# Patient Record
Sex: Female | Born: 1970 | Race: Black or African American | Hispanic: No
Health system: Southern US, Community
[De-identification: ages and names within clinical notes are randomized; demographics above are authoritative.]

## PROBLEM LIST (undated history)

## (undated) DIAGNOSIS — G009 Bacterial meningitis, unspecified: Secondary | ICD-10-CM

## (undated) HISTORY — DX: Bacterial meningitis, unspecified: G00.9

---

## 2003-05-25 ENCOUNTER — Encounter: Payer: Self-pay | Admitting: Emergency Medicine

## 2003-05-25 ENCOUNTER — Emergency Department (HOSPITAL_COMMUNITY): Admission: EM | Admit: 2003-05-25 | Discharge: 2003-05-25 | Payer: Self-pay | Admitting: Emergency Medicine

## 2006-11-19 ENCOUNTER — Encounter: Admission: RE | Admit: 2006-11-19 | Discharge: 2006-11-19 | Payer: Self-pay | Admitting: Internal Medicine

## 2010-12-29 ENCOUNTER — Encounter: Payer: Self-pay | Admitting: Internal Medicine

## 2013-09-03 ENCOUNTER — Ambulatory Visit (INDEPENDENT_AMBULATORY_CARE_PROVIDER_SITE_OTHER): Payer: 59 | Admitting: Internal Medicine

## 2013-09-03 VITALS — BP 100/60 | HR 69 | Temp 98.8°F | Resp 16 | Ht 62.5 in | Wt 186.6 lb

## 2013-09-03 DIAGNOSIS — L02529 Furuncle unspecified hand: Secondary | ICD-10-CM

## 2013-09-03 DIAGNOSIS — L02522 Furuncle left hand: Secondary | ICD-10-CM

## 2013-09-03 MED ORDER — DOXYCYCLINE HYCLATE 100 MG PO CAPS: 100.0000 mg | ORAL_CAPSULE | Freq: Two times a day (BID) | ORAL | Status: DC

## 2013-09-03 NOTE — Progress Notes (Signed)
  Subjective:    Patient ID: Paula Hoffman, female    DOB: May 12, 1971, 42 y.o.   MRN: [id number]  HPI Patient has had 1 week history of swelling of left index finger. Was gradual in onset, worse in last 24 hours with small amount yellow to clear drainage. Bumped it on the steering wheel this morning which caused considerable pain.  Has been taking ibuprofen 400 mg several times a day for pain, this has given her some relief, but not complete.  No fever. No injury, no exposure to roses/plants.  Review of Systems  Constitutional: Negative for fever, chills and diaphoresis.  Allergic/Immunologic: Positive for food allergies.  patient allergic to strawberries which cause severe nausea and headache.  Has not had regular care for 1 1/2 years. Denies chronic illness, regular medication use or hospitalization.  Does not use tobacco, alcohol,drugs    Objective:   Physical Exam Very pleasant female in NAD.  Left finger with swelling and redness 1cm dorsum index prox phalanx and small amount cream colored drainage. Full ROM, slightly warm.     Assessment & Plan:  Boil, finger, left Culture obtained Doxycycline 100 mg po BID x 10 day Keep clean with soap and warm water, warm compresses several times a day. Return if no improvement or if worsening symptoms, fever/chills, increased swelling. Patient will make appointment for CPE.   I fully participated in this exam and agree with documentation RPDoolittleMD

## 2013-09-03 NOTE — Patient Instructions (Signed)
Abscess An abscess is an infected area that contains a collection of pus and debris.It can occur in almost any part of the body. An abscess is also known as a furuncle or boil. CAUSES  An abscess occurs when tissue gets infected. This can occur from blockage of oil or sweat glands, infection of hair follicles, or a minor injury to the skin. As the body tries to fight the infection, pus collects in the area and creates pressure under the skin. This pressure causes pain. People with weakened immune systems have difficulty fighting infections and get certain abscesses more often.  SYMPTOMS Usually an abscess develops on the skin and becomes a painful mass that is red, warm, and tender. If the abscess forms under the skin, you may feel a moveable soft area under the skin. Some abscesses break open (rupture) on their own, but most will continue to get worse without care. The infection can spread deeper into the body and eventually into the bloodstream, causing you to feel ill.  DIAGNOSIS  Your caregiver will take your medical history and perform a physical exam. A sample of fluid may also be taken from the abscess to determine what is causing your infection. TREATMENT  Your caregiver may prescribe antibiotic medicines to fight the infection. However, taking antibiotics alone usually does not cure an abscess. Your caregiver may need to make a small cut (incision) in the abscess to drain the pus. In some cases, gauze is packed into the abscess to reduce pain and to continue draining the area. HOME CARE INSTRUCTIONS   Only take over-the-counter or prescription medicines for pain, discomfort, or fever as directed by your caregiver.  If you were prescribed antibiotics, take them as directed. Finish them even if you start to feel better.  If gauze is used, follow your caregiver's directions for changing the gauze.  To avoid spreading the infection:  Keep your draining abscess covered with a  bandage.  Wash your hands well.  Do not share personal care items, towels, or whirlpools with others.  Avoid skin contact with others.  Keep your skin and clothes clean around the abscess.  Keep all follow-up appointments as directed by your caregiver. SEEK MEDICAL CARE IF:   You have increased pain, swelling, redness, fluid drainage, or bleeding.  You have muscle aches, chills, or a general ill feeling.  You have a fever. MAKE SURE YOU:   Understand these instructions.  Will watch your condition.  Will get help right away if you are not doing well or get worse. Document Released: 09/03/2005 Document Revised: 05/25/2012 Document Reviewed: 02/06/2012 [employer] Patient Information 2014 [employer].  

## 2013-09-06 ENCOUNTER — Telehealth: Payer: Self-pay

## 2013-09-06 LAB — WOUND CULTURE: Gram Stain: NONE SEEN

## 2013-09-06 NOTE — Telephone Encounter (Signed)
Patient notified culture positive for MRSA and take all doxy. She voiced understanding.

## 2013-09-06 NOTE — Telephone Encounter (Signed)
Patient calling about lab results. Cb# [phone]

## 2013-11-23 ENCOUNTER — Other Ambulatory Visit: Payer: Self-pay

## 2013-12-31 ENCOUNTER — Ambulatory Visit (INDEPENDENT_AMBULATORY_CARE_PROVIDER_SITE_OTHER): Payer: 59 | Admitting: Internal Medicine

## 2013-12-31 VITALS — BP 128/80 | HR 83 | Temp 98.8°F | Resp 16 | Ht 62.0 in | Wt 190.0 lb

## 2013-12-31 DIAGNOSIS — R059 Cough, unspecified: Secondary | ICD-10-CM

## 2013-12-31 DIAGNOSIS — J019 Acute sinusitis, unspecified: Secondary | ICD-10-CM

## 2013-12-31 DIAGNOSIS — R509 Fever, unspecified: Secondary | ICD-10-CM

## 2013-12-31 DIAGNOSIS — R05 Cough: Secondary | ICD-10-CM

## 2013-12-31 LAB — POCT CBC
GRANULOCYTE PERCENT: 80 % (ref 37–80)
HEMATOCRIT: 32.5 % — AB (ref 37.7–47.9)
Hemoglobin: 9.8 g/dL — AB (ref 12.2–16.2)
Lymph, poc: 2.1 (ref 0.6–3.4)
MCH, POC: 25.7 pg — AB (ref 27–31.2)
MCHC: 30.2 g/dL — AB (ref 31.8–35.4)
MCV: 85 fL (ref 80–97)
MID (cbc): 0.4 (ref 0–0.9)
MPV: 6.9 fL (ref 0–99.8)
POC GRANULOCYTE: 10.1 — AB (ref 2–6.9)
POC LYMPH %: 16.5 % (ref 10–50)
POC MID %: 3.5 % (ref 0–12)
Platelet Count, POC: 468 10*3/uL — AB (ref 142–424)
RBC: 3.82 M/uL — AB (ref 4.04–5.48)
RDW, POC: 15.3 %
WBC: 12.6 10*3/uL — AB (ref 4.6–10.2)

## 2013-12-31 MED ORDER — AMOXICILLIN 875 MG PO TABS: 875.0000 mg | ORAL_TABLET | Freq: Two times a day (BID) | ORAL | Status: DC

## 2013-12-31 NOTE — Progress Notes (Signed)
Subjective:    Patient ID: Paula Hoffman, female    DOB: 10-30-1971, 43 y.o.   MRN: [id number] This chart was scribed for Ellamae Sia, MD by Nicholos Johns, Medical Scribe. This patient's care was started at 10:30 AM.  HPI HPI Comments: Paula Hoffman is a 43 y.o. female who presents to the [facility] for several complaints. Pt reports clear rhinorrhea, onset 1 week ago. States nose is "running like a faucet mainly from right nostril." She also reports HA, onset 4 days ago, throbbing at the base of neck radiating to lower back. States neck is mildy stiff. Reports some mild intermittent SOB that also began at that time. Reports left ear pain and pressure began, onset 1 day ago. Dry cough. Spells of hot and cold, and chills. Taking advil, dayquil, and nyquil with some relief. Pt is anemic and in the past has had slow release iron treatments; is currently not doing the treatments anymore. Pt has a regular PCP. Denies sore throat.   Review of Systems    Objective:  Physical Exam  Nursing note and vitals reviewed. Constitutional: She is oriented to person, place, and time. She appears well-developed and well-nourished. No distress.  HENT:  Head: Normocephalic and atraumatic.  Right Ear: External ear normal.  Left Ear: External ear normal.  Copious rhinorrhea. Purulent. Tender maxillary areas  Eyes: EOM are normal.  Neck: Neck supple. No tracheal deviation present.  Cardiovascular: Normal rate, regular rhythm and normal heart sounds.  Exam reveals no gallop and no friction rub.   No murmur heard. Pulmonary/Chest: Effort normal and breath sounds normal. No respiratory distress. She has no wheezes. She has no rales.  Musculoskeletal: Normal range of motion.  Neurological: She is alert and oriented to person, place, and time.  Skin: Skin is warm and dry.  Psychiatric: She has a normal mood and affect. Her behavior is normal.   Results for  orders placed in visit on 12/31/13  POCT CBC      Result Value Range   WBC 12.6 (*) 4.6 - 10.2 K/uL   Lymph, poc 2.1  0.6 - 3.4   POC LYMPH PERCENT 16.5  10 - 50 %L   MID (cbc) 0.4  0 - 0.9   POC MID % 3.5  0 - 12 %M   POC Granulocyte 10.1 (*) 2 - 6.9   Granulocyte percent 80.0  37 - 80 %G   RBC 3.82 (*) 4.04 - 5.48 M/uL   Hemoglobin 9.8 (*) 12.2 - 16.2 g/dL   HCT, POC 82.9 (*) 56.2 - 47.9 %   MCV 85.0  80 - 97 fL   MCH, POC 25.7 (*) 27 - 31.2 pg   MCHC 30.2 (*) 31.8 - 35.4 g/dL   RDW, POC 13.0     Platelet Count, POC 468 (*) 142 - 424 K/uL   MPV 6.9  0 - 99.8 fL    Assessment & Plan:  Pt is advised she will be treated for a bacterial infection with amoxicillin. Pt is also advised she can use Sudafed to treat the rhinorrhea. Pt is encouraged to start back slow release iron treatments.   I have completed the patient encounter in its entirety as documented by the scribe, with editing by me where necessary. Harley Mccartney P. Merla Riches, M.D. Fever, unspecified - Plan: POCT CBC  Acute sinusitis, unspecified - Plan: amoxicillin (AMOXIL) 875 MG tablet  Meds ordered this encounter  Medications  . amoxicillin (AMOXIL) 875 MG  tablet    Sig: Take 1 tablet (875 mg total) by mouth 2 (two) times daily.    Dispense:  20 tablet    Refill:  0

## 2014-11-13 ENCOUNTER — Emergency Department (HOSPITAL_COMMUNITY): Payer: 59

## 2014-11-13 ENCOUNTER — Encounter (HOSPITAL_COMMUNITY): Payer: Self-pay | Admitting: Family Medicine

## 2014-11-13 ENCOUNTER — Emergency Department (HOSPITAL_COMMUNITY)
Admission: EM | Admit: 2014-11-13 | Discharge: 2014-11-13 | Disposition: A | Discharge status: Home / Self Care | Payer: 59 | Attending: Emergency Medicine | Admitting: Emergency Medicine

## 2014-11-13 DIAGNOSIS — R519 Headache, unspecified: Secondary | ICD-10-CM

## 2014-11-13 DIAGNOSIS — M542 Cervicalgia: Secondary | ICD-10-CM | POA: Insufficient documentation

## 2014-11-13 DIAGNOSIS — Z792 Long term (current) use of antibiotics: Secondary | ICD-10-CM | POA: Diagnosis not present

## 2014-11-13 DIAGNOSIS — H53149 Visual discomfort, unspecified: Secondary | ICD-10-CM | POA: Insufficient documentation

## 2014-11-13 DIAGNOSIS — R51 Headache: Secondary | ICD-10-CM | POA: Diagnosis present

## 2014-11-13 DIAGNOSIS — R112 Nausea with vomiting, unspecified: Secondary | ICD-10-CM | POA: Insufficient documentation

## 2014-11-13 LAB — CBC WITH DIFFERENTIAL/PLATELET
BASOS ABS: 0 10*3/uL (ref 0.0–0.1)
BASOS PCT: 0 % (ref 0–1)
EOS ABS: 0 10*3/uL (ref 0.0–0.7)
Eosinophils Relative: 0 % (ref 0–5)
HCT: 33 % — ABNORMAL LOW (ref 36.0–46.0)
Hemoglobin: 11.1 g/dL — ABNORMAL LOW (ref 12.0–15.0)
Lymphocytes Relative: 11 % — ABNORMAL LOW (ref 12–46)
Lymphs Abs: 1.1 10*3/uL (ref 0.7–4.0)
MCH: 29.5 pg (ref 26.0–34.0)
MCHC: 33.6 g/dL (ref 30.0–36.0)
MCV: 87.8 fL (ref 78.0–100.0)
MONOS PCT: 6 % (ref 3–12)
Monocytes Absolute: 0.6 10*3/uL (ref 0.1–1.0)
NEUTROS ABS: 8.1 10*3/uL — AB (ref 1.7–7.7)
Neutrophils Relative %: 83 % — ABNORMAL HIGH (ref 43–77)
PLATELETS: 374 10*3/uL (ref 150–400)
RBC: 3.76 MIL/uL — ABNORMAL LOW (ref 3.87–5.11)
RDW: 13.3 % (ref 11.5–15.5)
WBC: 9.8 10*3/uL (ref 4.0–10.5)

## 2014-11-13 LAB — I-STAT CHEM 8, ED
BUN: 3 mg/dL — ABNORMAL LOW (ref 6–23)
Calcium, Ion: 1.14 mmol/L (ref 1.12–1.23)
Chloride: 100 mEq/L (ref 96–112)
Creatinine, Ser: 0.6 mg/dL (ref 0.50–1.10)
GLUCOSE: 118 mg/dL — AB (ref 70–99)
HCT: 35 % — ABNORMAL LOW (ref 36.0–46.0)
HEMOGLOBIN: 11.9 g/dL — AB (ref 12.0–15.0)
POTASSIUM: 3.3 meq/L — AB (ref 3.7–5.3)
Sodium: 137 mEq/L (ref 137–147)
TCO2: 24 mmol/L (ref 0–100)

## 2014-11-13 MED ORDER — TRAMADOL HCL 50 MG PO TABS: 50.0000 mg | ORAL_TABLET | Freq: Four times a day (QID) | ORAL | Status: DC | PRN

## 2014-11-13 MED ORDER — PROMETHAZINE HCL 25 MG PO TABS: 25.0000 mg | ORAL_TABLET | Freq: Four times a day (QID) | ORAL | Status: DC | PRN

## 2014-11-13 MED ORDER — SODIUM CHLORIDE 0.9 % IV BOLUS (SEPSIS)
500.0000 mL | Freq: Once | INTRAVENOUS | Status: AC
  Administered 2014-11-13: 500 mL via INTRAVENOUS

## 2014-11-13 MED ORDER — VALPROATE SODIUM 500 MG/5ML IV SOLN
500.0000 mg | Freq: Once | INTRAVENOUS | Status: AC
  Administered 2014-11-13: 500 mg via INTRAVENOUS
  Filled 2014-11-13: qty 5

## 2014-11-13 MED ORDER — PROMETHAZINE HCL 25 MG/ML IJ SOLN
25.0000 mg | Freq: Once | INTRAMUSCULAR | Status: AC
  Administered 2014-11-13: 25 mg via INTRAVENOUS
  Filled 2014-11-13 (×2): qty 1

## 2014-11-13 MED ORDER — FENTANYL CITRATE 0.05 MG/ML IJ SOLN
50.0000 ug | Freq: Once | INTRAMUSCULAR | Status: AC
  Administered 2014-11-13: 50 ug via INTRAVENOUS
  Filled 2014-11-13: qty 2

## 2014-11-13 MED ORDER — KETOROLAC TROMETHAMINE 30 MG/ML IJ SOLN
30.0000 mg | Freq: Once | INTRAMUSCULAR | Status: AC
  Administered 2014-11-13: 30 mg via INTRAVENOUS
  Filled 2014-11-13: qty 1
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
## 2014-11-13 NOTE — Discharge Instructions (Signed)

## 2014-11-13 NOTE — ED Notes (Signed)
Patient transported to CT 

## 2014-11-13 NOTE — ED Notes (Signed)
PT presents from home via POV with c/o headache that awoke pt from sleep this morning at 0345. PT reports pressure to anterior head that is worse with movement. Pt also reports emesis around 0730 and patient was actively vomiting upon arrival to room. PT is A&Ox4 and denies room-spinning dizziness or any focal neuro deficits.

## 2014-11-13 NOTE — ED Notes (Signed)
Pt remains monitored by blood pressure, pulse ox, and 5 lead.  

## 2014-11-13 NOTE — ED Notes (Signed)
Pt placed on monitor upon return to room. Pt was able to ambulate to restroom using stand by assistance. Pt remains monitored by blood pressure, pulse ox, and 5 lead.

## 2014-11-15 ENCOUNTER — Emergency Department (HOSPITAL_COMMUNITY)
Admission: EM | Admit: 2014-11-15 | Discharge: 2014-11-15 | Disposition: A | Discharge status: Home / Self Care | Payer: 59 | Attending: Emergency Medicine | Admitting: Emergency Medicine

## 2014-11-15 ENCOUNTER — Encounter (HOSPITAL_COMMUNITY): Payer: Self-pay | Admitting: *Deleted

## 2014-11-15 DIAGNOSIS — Z79899 Other long term (current) drug therapy: Secondary | ICD-10-CM | POA: Diagnosis not present

## 2014-11-15 DIAGNOSIS — Z7982 Long term (current) use of aspirin: Secondary | ICD-10-CM | POA: Diagnosis not present

## 2014-11-15 DIAGNOSIS — R519 Headache, unspecified: Secondary | ICD-10-CM

## 2014-11-15 DIAGNOSIS — Z720 Tobacco use: Secondary | ICD-10-CM | POA: Diagnosis not present

## 2014-11-15 DIAGNOSIS — R0981 Nasal congestion: Secondary | ICD-10-CM | POA: Insufficient documentation

## 2014-11-15 DIAGNOSIS — R51 Headache: Secondary | ICD-10-CM | POA: Insufficient documentation

## 2014-11-15 MED ORDER — METOCLOPRAMIDE HCL 5 MG/ML IJ SOLN
10.0000 mg | Freq: Once | INTRAMUSCULAR | Status: AC
  Administered 2014-11-15: 10 mg via INTRAMUSCULAR
  Filled 2014-11-15: qty 2

## 2014-11-15 MED ORDER — METOCLOPRAMIDE HCL 5 MG/ML IJ SOLN: 10.0000 mg | Freq: Once | INTRAMUSCULAR | Status: DC

## 2014-11-15 MED ORDER — KETOROLAC TROMETHAMINE 60 MG/2ML IM SOLN
60.0000 mg | Freq: Once | INTRAMUSCULAR | Status: AC
  Administered 2014-11-15: 60 mg via INTRAMUSCULAR
  Filled 2014-11-15: qty 2

## 2014-11-15 NOTE — ED Notes (Signed)
Pt was seen here on 12/7 for headache, prescribed tramdol and phenergan, reports no relief of headache, nausea and sensitivity to light.

## 2014-11-15 NOTE — ED Provider Notes (Signed)
CSN: [id number]     Arrival date & time 11/15/14  1026 History   First MD Initiated Contact with Patient 11/15/14 1317     Chief Complaint  Patient presents with  . Headache     (Consider location/radiation/quality/duration/timing/severity/associated sxs/prior Treatment) Patient is a 43 y.o. female presenting with headaches. The history is provided by the patient. No language interpreter was used.  Headache Pain location:  Generalized Quality:  Sharp Radiates to:  Does not radiate Severity currently:  8/10 Severity at highest:  8/10 Onset quality:  Gradual Duration:  3 days Timing:  Constant Progression:  Worsening Chronicity:  New Similar to prior headaches: no   Context: not activity   Relieved by:  Nothing Worsened by:  Nothing tried Associated symptoms: congestion and URI   Associated symptoms: no cough   Risk factors: no family hx of SAH     History reviewed. No pertinent past medical history. History reviewed. No pertinent past surgical history. Family History  Problem Relation Age of Onset  . Hypertension Mother   . Cancer Mother   . Prostate cancer Father    History  Substance Use Topics  . Smoking status: Never Smoker   . Smokeless tobacco: Not on file  . Alcohol Use: No   OB History    No data available     Review of Systems  HENT: Positive for congestion.   Respiratory: Negative for cough.   Neurological: Positive for headaches.  All other systems reviewed and are negative.     Allergies  Percocet and Strawberry  Home Medications   Prior to Admission medications   Medication Sig Start Date End Date Taking? Authorizing Provider  amoxicillin (AMOXIL) 875 MG tablet Take 1 tablet (875 mg total) by mouth 2 (two) times daily. Patient not taking: Reported on 11/13/2014 12/31/13   Tonye Pearsonobert P Doolittle, MD  aspirin-acetaminophen-caffeine [facility](EXCEDRIN MIGRAINE) [phone] [id number] MG per tablet Take 1 tablet by mouth every 6 (six) hours as needed for headache.     Historical Provider, MD  doxycycline (VIBRAMYCIN) 100 MG capsule Take 1 capsule (100 mg total) by mouth 2 (two) times daily. Patient not taking: Reported on 11/13/2014 09/03/13   Tonye Pearsonobert P Doolittle, MD  Doxylamine-Phenylephrine-APAP (VICKS NYQUIL SINUS PO) Take by mouth.    Historical Provider, MD  Phenylephrine-Acetaminophen (VICKS DAYQUIL SINUS PO) Take by mouth.    Historical Provider, MD  promethazine (PHENERGAN) 25 MG tablet Take 1 tablet (25 mg total) by mouth every 6 (six) hours as needed for nausea. 11/13/14   Juliet RudeNathan R. Pickering, MD  traMADol (ULTRAM) 50 MG tablet Take 1 tablet (50 mg total) by mouth every 6 (six) hours as needed. 11/13/14   Juliet RudeNathan R. Pickering, MD   BP 132/67 mmHg  Pulse 79  Temp(Src) 99.9 F (37.7 C) (Oral)  Resp 17  SpO2 100%  LMP 10/27/2014 (Approximate) Physical Exam  Constitutional: She is oriented to person, place, and time. She appears well-developed and well-nourished.  HENT:  Head: Normocephalic and atraumatic.  Eyes: Conjunctivae and EOM are normal. Pupils are equal, round, and reactive to light.  Neck: Normal range of motion.  Cardiovascular: Normal rate, regular rhythm and normal heart sounds.   Pulmonary/Chest: Effort normal and breath sounds normal.  Abdominal: Soft. She exhibits no distension.  Musculoskeletal: Normal range of motion.  Neurological: She is alert and oriented to person, place, and time.  Skin: Skin is warm.  Psychiatric: She has a normal mood and affect.  Nursing note and vitals reviewed.   ED  Course  Procedures (including critical care time) Labs Review Labs Reviewed - No data to display  Imaging Review No results found.   EKG Interpretation None      MDM  Pt had normal labs 2 days ago.  Normal ct of head.   Pt reports she feels only slightly better.     Final diagnoses:  Nonintractable headache, unspecified chronicity pattern, unspecified headache type    Pt offered migraine cocktail IV.  Pt declined Iv  medications.  Pt will accept injection.  Pt is improving.    Lonia SkinnerLeslie K AyrshireSofia, PA-C 11/15/14 1529  Vanetta MuldersScott Zackowski, MD 11/16/14 [phone]

## 2014-11-15 NOTE — Discharge Instructions (Signed)

## 2014-11-18 ENCOUNTER — Encounter (HOSPITAL_COMMUNITY): Payer: Self-pay | Admitting: Emergency Medicine

## 2014-11-18 ENCOUNTER — Inpatient Hospital Stay (HOSPITAL_COMMUNITY)
Admission: EM | Admit: 2014-11-18 | Discharge: 2014-11-23 | DRG: 025 | Disposition: A | Discharge status: Home Health | Payer: 59 | Attending: Family Medicine | Admitting: Family Medicine

## 2014-11-18 ENCOUNTER — Emergency Department (HOSPITAL_COMMUNITY): Payer: 59

## 2014-11-18 DIAGNOSIS — E162 Hypoglycemia, unspecified: Secondary | ICD-10-CM | POA: Diagnosis not present

## 2014-11-18 DIAGNOSIS — D509 Iron deficiency anemia, unspecified: Secondary | ICD-10-CM | POA: Diagnosis present

## 2014-11-18 DIAGNOSIS — G960 Cerebrospinal fluid leak: Principal | ICD-10-CM | POA: Diagnosis present

## 2014-11-18 DIAGNOSIS — D649 Anemia, unspecified: Secondary | ICD-10-CM

## 2014-11-18 DIAGNOSIS — Q018 Encephalocele of other sites: Secondary | ICD-10-CM

## 2014-11-18 DIAGNOSIS — G009 Bacterial meningitis, unspecified: Secondary | ICD-10-CM | POA: Diagnosis present

## 2014-11-18 DIAGNOSIS — G039 Meningitis, unspecified: Secondary | ICD-10-CM | POA: Diagnosis present

## 2014-11-18 DIAGNOSIS — G9601 Cranial cerebrospinal fluid leak, spontaneous: Secondary | ICD-10-CM | POA: Diagnosis present

## 2014-11-18 DIAGNOSIS — R519 Headache, unspecified: Secondary | ICD-10-CM

## 2014-11-18 DIAGNOSIS — J323 Chronic sphenoidal sinusitis: Secondary | ICD-10-CM | POA: Diagnosis present

## 2014-11-18 DIAGNOSIS — R51 Headache: Secondary | ICD-10-CM

## 2014-11-18 LAB — BASIC METABOLIC PANEL WITH GFR
Anion gap: 13 (ref 5–15)
BUN: 6 mg/dL (ref 6–23)
CO2: 24 meq/L (ref 19–32)
Calcium: 8.9 mg/dL (ref 8.4–10.5)
Chloride: 97 meq/L (ref 96–112)
Creatinine, Ser: 0.72 mg/dL (ref 0.50–1.10)
GFR calc Af Amer: >90 mL/min
GFR calc non Af Amer: >90 mL/min
Glucose, Bld: 99 mg/dL (ref 70–99)
Potassium: 3.4 meq/L — ABNORMAL LOW (ref 3.7–5.3)
Sodium: 134 meq/L — ABNORMAL LOW (ref 137–147)

## 2014-11-18 LAB — GRAM STAIN

## 2014-11-18 LAB — CBC WITH DIFFERENTIAL/PLATELET
Basophils Absolute: 0 10*3/uL (ref 0.0–0.1)
Basophils Relative: 0 % (ref 0–1)
Eosinophils Absolute: 0 10*3/uL (ref 0.0–0.7)
Eosinophils Relative: 0 % (ref 0–5)
HCT: 33.6 % — ABNORMAL LOW (ref 36.0–46.0)
Hemoglobin: 11.1 g/dL — ABNORMAL LOW (ref 12.0–15.0)
Lymphocytes Relative: 17 % (ref 12–46)
Lymphs Abs: 1.8 10*3/uL (ref 0.7–4.0)
MCH: 28.2 pg (ref 26.0–34.0)
MCHC: 33 g/dL (ref 30.0–36.0)
MCV: 85.5 fL (ref 78.0–100.0)
Monocytes Absolute: 0.9 10*3/uL (ref 0.1–1.0)
Monocytes Relative: 9 % (ref 3–12)
Neutro Abs: 7.8 10*3/uL — ABNORMAL HIGH (ref 1.7–7.7)
Neutrophils Relative %: 74 % (ref 43–77)
Platelets: 390 10*3/uL (ref 150–400)
RBC: 3.93 MIL/uL (ref 3.87–5.11)
RDW: 13 % (ref 11.5–15.5)
WBC: 10.5 10*3/uL (ref 4.0–10.5)

## 2014-11-18 LAB — GLUCOSE, CSF: Glucose, CSF: 22 mg/dL — CL (ref 43–76)

## 2014-11-18 LAB — CSF CELL COUNT WITH DIFFERENTIAL
Eosinophils, CSF: 1 % (ref 0–1)
Lymphs, CSF: 5 % — ABNORMAL LOW (ref 40–80)
Lymphs, CSF: 7 % — ABNORMAL LOW (ref 40–80)
Monocyte-Macrophage-Spinal Fluid: 2 % — ABNORMAL LOW (ref 15–45)
Monocyte-Macrophage-Spinal Fluid: 9 % — ABNORMAL LOW (ref 15–45)
RBC Count, CSF: 2 /mm3 — ABNORMAL HIGH
RBC Count, CSF: 7 /mm3 — ABNORMAL HIGH
Segmented Neutrophils-CSF: 86 % — ABNORMAL HIGH (ref 0–6)
Segmented Neutrophils-CSF: 90 % — ABNORMAL HIGH (ref 0–6)
Tube #: 1
Tube #: 4
WBC, CSF: 2000 /mm3 (ref 0–5)
WBC, CSF: 2900 /mm3 (ref 0–5)

## 2014-11-18 LAB — PROTEIN, CSF: Total  Protein, CSF: 126 mg/dL — ABNORMAL HIGH (ref 15–45)

## 2014-11-18 MED ORDER — PROCHLORPERAZINE EDISYLATE 5 MG/ML IJ SOLN
10.0000 mg | Freq: Four times a day (QID) | INTRAMUSCULAR | Status: DC | PRN
  Administered 2014-11-18: 10 mg via INTRAVENOUS
  Filled 2014-11-18 (×2): qty 2

## 2014-11-18 MED ORDER — VANCOMYCIN HCL IN DEXTROSE 1-5 GM/200ML-% IV SOLN
1000.0000 mg | Freq: Three times a day (TID) | INTRAVENOUS | Status: DC
  Administered 2014-11-19 – 2014-11-23 (×13): 1000 mg via INTRAVENOUS
  Filled 2014-11-18 (×17): qty 200

## 2014-11-18 MED ORDER — KETOROLAC TROMETHAMINE 15 MG/ML IJ SOLN
15.0000 mg | Freq: Once | INTRAMUSCULAR | Status: AC
  Administered 2014-11-18: 15 mg via INTRAVENOUS
  Filled 2014-11-18: qty 1

## 2014-11-18 MED ORDER — DEXTROSE 5 % IV SOLN: 2.0000 g | Freq: Two times a day (BID) | INTRAVENOUS | Status: DC

## 2014-11-18 MED ORDER — DEXTROSE 5 % IV SOLN
2.0000 g | Freq: Two times a day (BID) | INTRAVENOUS | Status: DC
  Administered 2014-11-19 – 2014-11-23 (×10): 2 g via INTRAVENOUS
  Filled 2014-11-18 (×11): qty 2

## 2014-11-18 MED ORDER — SODIUM CHLORIDE 0.9 % IV SOLN
1250.0000 mg | Freq: Once | INTRAVENOUS | Status: AC
  Administered 2014-11-18: 1250 mg via INTRAVENOUS
  Filled 2014-11-18: qty 1250

## 2014-11-18 MED ORDER — DIPHENHYDRAMINE HCL 50 MG/ML IJ SOLN
12.5000 mg | Freq: Once | INTRAMUSCULAR | Status: AC
  Administered 2014-11-18: 12.5 mg via INTRAVENOUS
  Filled 2014-11-18: qty 1

## 2014-11-18 MED ORDER — SODIUM CHLORIDE 0.9 % IV BOLUS (SEPSIS)
1000.0000 mL | Freq: Once | INTRAVENOUS | Status: AC
  Administered 2014-11-18: 1000 mL via INTRAVENOUS

## 2014-11-18 MED ORDER — LIDOCAINE HCL (PF) 1 % IJ SOLN
30.0000 mL | Freq: Once | INTRAMUSCULAR | Status: AC
  Administered 2014-11-18: 30 mL
  Filled 2014-11-18: qty 30

## 2014-11-18 MED ORDER — DEXTROSE 5 % IV SOLN
2.0000 g | Freq: Once | INTRAVENOUS | Status: AC
  Administered 2014-11-18: 2 g via INTRAVENOUS
  Filled 2014-11-18: qty 2

## 2014-11-18 MED ORDER — DEXAMETHASONE SODIUM PHOSPHATE 10 MG/ML IJ SOLN
10.0000 mg | Freq: Once | INTRAMUSCULAR | Status: AC
  Administered 2014-11-18: 10 mg via INTRAVENOUS
  Filled 2014-11-18: qty 1

## 2014-11-18 MED ORDER — IOHEXOL 350 MG/ML SOLN
50.0000 mL | Freq: Once | INTRAVENOUS | Status: AC | PRN
  Administered 2014-11-18: 50 mL via INTRAVENOUS

## 2014-11-18 MED ORDER — ENOXAPARIN SODIUM 40 MG/0.4ML ~~LOC~~ SOLN
40.0000 mg | SUBCUTANEOUS | Status: DC
  Administered 2014-11-18 – 2014-11-19 (×2): 40 mg via SUBCUTANEOUS
  Filled 2014-11-18 (×2): qty 0.4

## 2014-11-18 NOTE — Progress Notes (Signed)
Pt arrived on unit 2115 hrs, A&O, no C/O pain, temp 100.6, family medicine on call notified, pt oriented to room and equipment.

## 2014-11-18 NOTE — ED Notes (Signed)
Headache that started on Monday morning; seen on Monday, headache never went away, worse by Wednesday despite taking Tramadol and phenergan. On Wednesday, got IM medication and headache subsided enough to function through Friday. Yesterday it started to get worse again and by 0300 this morning pressure she cannot stand. Frontal headache and behind eyes. Sensitive to noise and light she states. States a pulsating pressure pain.

## 2014-11-18 NOTE — ED Notes (Signed)
CSF WBC 2900 and 2000; reported to Dr. Juleen ChinaKohut at [phone]

## 2014-11-18 NOTE — ED Notes (Signed)
Dr. Juleen ChinaKohut at bedside for LP

## 2014-11-18 NOTE — Progress Notes (Signed)
ANTIBIOTIC CONSULT NOTE - INITIAL  Pharmacy Consult:  Vancomycin Indication:  Meningitis  Allergies  Allergen Reactions  . Percocet [Oxycodone-Acetaminophen] Nausea Only and Other (See Comments)    Feels high  . Strawberry Nausea Only    Headache  . Tramadol Nausea Only    Patient Measurements: Height: 5\' 1"  (154.9 cm) Weight: 185 lb (83.915 kg) IBW/kg (Calculated) : 47.8  Vital Signs: Temp: 100.9 F (38.3 C) (12/12 1332) Temp Source: Oral (12/12 1332) BP: 136/80 mmHg (12/12 1530) Pulse Rate: 77 (12/12 1530)  Labs:  Recent Labs  11/18/14 1330  WBC 10.5  HGB 11.1*  PLT 390  CREATININE 0.72   Estimated Creatinine Clearance: 88.1 mL/min (by C-G formula based on Cr of 0.72). No results for input(s): VANCOTROUGH, VANCOPEAK, VANCORANDOM, GENTTROUGH, GENTPEAK, GENTRANDOM, TOBRATROUGH, TOBRAPEAK, TOBRARND, AMIKACINPEAK, AMIKACINTROU, AMIKACIN in the last 72 hours.   Microbiology: Recent Results (from the past 720 hour(s))  Gram stain - STAT with CSF culture     Status: None   Collection Time: 11/18/14  3:30 PM  Result Value Ref Range Status   Specimen Description CSF  Final   Special Requests NONE  Final   Gram Stain   Final    CYTOSPIN WBC PRESENT,BOTH PMN AND MONONUCLEAR NO ORGANISMS SEEN    Report Status 11/18/2014 FINAL  Final    Medical History: History reviewed. No pertinent past medical history.    Assessment: [age] YOF presented for the 3rd time this week with worsening headaches.  Pharmacy consulted to initiate vancomycin for rule out meningitis.  Noted Rocephin 2gm IV x 1 has been ordered.  Baseline labs reviewed.   Goal of Therapy:  Vancomycin trough level 15-20 mcg/ml   Plan:  - Vanc 1250mg  IV x 1 now, then 1gm IV Q8H - F/U with order to continue Rocephin - Monitor renal fxn, clinical progress, vanc trough at C[facility]    Aria Pickrell D. Laney Potashang, PharmD, BCPS Pager:  [phone] 11/18/2014, 4:28 PM

## 2014-11-18 NOTE — ED Provider Notes (Signed)
CSN: [id number]     Arrival date & time 11/18/14  1145 History   First MD Initiated Contact with Patient 11/18/14 1202     No chief complaint on file.    (Consider location/radiation/quality/duration/timing/severity/associated sxs/prior Treatment) HPI   43 year old female with headache. Onset approximately 5 days ago. Headache has been  persistent since. Pain is primarily in the frontal region "extending to the ear line. I feel like I have a migraine, the flu and sinusitis all at once."  Seen twice in ED for same. Previous w/u including CT head unremarkable. Subjective fever. Photo and phonophobia. No change in visual acuity. No blood thinners. No personal or family hx of cerebral aneurysm. Does have some neck pain in R lateral neck. Headache is worse when sitting up/leaning forward. No eye pain or drainage.    History reviewed. No pertinent past medical history. History reviewed. No pertinent past surgical history. Family History  Problem Relation Age of Onset  . Hypertension Mother   . Cancer Mother   . Prostate cancer Father    History  Substance Use Topics  . Smoking status: Never Smoker   . Smokeless tobacco: Not on file  . Alcohol Use: No   OB History    No data available     Review of Systems  All systems reviewed and negative, other than as noted in HPI.   Allergies  Percocet; Strawberry; and Tramadol  Home Medications   Prior to Admission medications   Medication Sig Start Date End Date Taking? Authorizing Provider  aspirin-sod bicarb-citric acid (ALKA-SELTZER) 325 MG TBEF tablet Take 325 mg by mouth every 6 (six) hours as needed.   Yes Historical Provider, MD  promethazine (PHENERGAN) 25 MG tablet Take 1 tablet (25 mg total) by mouth every 6 (six) hours as needed for nausea. 11/13/14  Yes Juliet RudeNathan R. Pickering, MD  amoxicillin (AMOXIL) 875 MG tablet Take 1 tablet (875 mg total) by mouth 2 (two) times daily. Patient not taking: Reported on 11/13/2014 12/31/13    Tonye Pearsonobert P Doolittle, MD  doxycycline (VIBRAMYCIN) 100 MG capsule Take 1 capsule (100 mg total) by mouth 2 (two) times daily. Patient not taking: Reported on 11/13/2014 09/03/13   Tonye Pearsonobert P Doolittle, MD  traMADol (ULTRAM) 50 MG tablet Take 1 tablet (50 mg total) by mouth every 6 (six) hours as needed. Patient not taking: Reported on 11/18/2014 11/13/14   Juliet RudeNathan R. Pickering, MD   BP 143/88 mmHg  Pulse 96  Temp(Src) 100.9 F (38.3 C) (Oral)  Resp 20  Ht 5\' 1"  (1.549 m)  Wt 185 lb (83.915 kg)  BMI 34.97 kg/m2  SpO2 100%  LMP 10/27/2014 (Approximate) Physical Exam  Constitutional: She is oriented to person, place, and time. She appears well-developed and well-nourished. No distress.  Laying in bed with eyes closed/lights out.   HENT:  Head: Normocephalic and atraumatic.  Eyes: Conjunctivae are normal. Pupils are equal, round, and reactive to light. Right eye exhibits no discharge. Left eye exhibits no discharge.  Neck: Normal range of motion. Neck supple.  No nuchal rigidty  Cardiovascular: Normal rate, regular rhythm and normal heart sounds.  Exam reveals no gallop and no friction rub.   No murmur heard. Pulmonary/Chest: Effort normal and breath sounds normal. No respiratory distress.  Abdominal: Soft. She exhibits no distension. There is no tenderness.  Musculoskeletal: She exhibits no edema or tenderness.  Neurological: She is alert and oriented to person, place, and time. No cranial nerve deficit. She exhibits normal muscle tone. Coordination  normal.  Good finger to nose b/l.   Skin: Skin is warm and dry. No rash noted.  Psychiatric: She has a normal mood and affect. Her behavior is normal. Thought content normal.  Nursing note and vitals reviewed.   ED Course  LUMBAR PUNCTURE Date/Time: 11/18/2014 3:20 PM Performed by: Raeford Razor Authorized by: Raeford Razor Consent: Verbal consent obtained. Risks and benefits: risks, benefits and alternatives were discussed Consent given  by: patient Required items: required blood products, implants, devices, and special equipment available Indications: evaluation for infection Anesthesia: local infiltration Local anesthetic: lidocaine 1% without epinephrine Anesthetic total: 5 ml Patient sedated: no Preparation: Patient was prepped and draped in the usual sterile fashion. Lumbar space: L3-L4 interspace Patient's position: left lateral decubitus Needle gauge: 20 Needle length: 3.5 in Number of attempts: 1 Fluid appearance: clear Tubes of fluid: 4 Total volume: 5 ml Post-procedure: adhesive bandage applied Patient tolerance: Patient tolerated the procedure well with no immediate complications    CRITICAL CARE Performed by: Raeford Razor   Total critical care time: 30 minutes  Critical care time was exclusive of separately billable procedures and treating other patients. Critical care was necessary to treat or prevent imminent or life-threatening deterioration. Critical care was time spent personally by me on the following activities: development of treatment plan with patient and/or surrogate as well as nursing, discussions with consultants, evaluation of patient's response to treatment, examination of patient, obtaining history from patient or surrogate, ordering and performing treatments and interventions, ordering and review of laboratory studies, ordering and review of radiographic studies, pulse oximetry and re-evaluation of patient's condition.   (including critical care time)  Labs Review Labs Reviewed  CBC WITH DIFFERENTIAL - Abnormal; Notable for the following:    Hemoglobin 11.1 (*)    HCT 33.6 (*)    Neutro Abs 7.8 (*)    All other components within normal limits  BASIC METABOLIC PANEL - Abnormal; Notable for the following:    Sodium 134 (*)    Potassium 3.4 (*)    All other components within normal limits  CSF CELL COUNT WITH DIFFERENTIAL - Abnormal; Notable for the following:    Appearance, CSF  CLOUDY (*)    RBC Count, CSF 7 (*)    WBC, CSF 2900 (*)    Segmented Neutrophils-CSF 86 (*)    Lymphs, CSF 5 (*)    Monocyte-Macrophage-Spinal Fluid 9 (*)    All other components within normal limits  CSF CELL COUNT WITH DIFFERENTIAL - Abnormal; Notable for the following:    Appearance, CSF CLOUDY (*)    RBC Count, CSF 2 (*)    WBC, CSF 2000 (*)    Segmented Neutrophils-CSF 90 (*)    Lymphs, CSF 7 (*)    Monocyte-Macrophage-Spinal Fluid 2 (*)    All other components within normal limits  GLUCOSE, CSF - Abnormal; Notable for the following:    Glucose, CSF 22 (*)    All other components within normal limits  PROTEIN, CSF - Abnormal; Notable for the following:    Total  Protein, CSF 126 (*)    All other components within normal limits  GRAM STAIN  CSF CULTURE  CULTURE, BLOOD (ROUTINE X 2)  CULTURE, BLOOD (ROUTINE X 2)    Imaging Review Ct Angio Head W/cm &/or Wo Cm  11/18/2014   CLINICAL DATA:  Headache for 5 days, worse yesterday. Pain located in the frontal area and behind the eyes. Light sensitivity.  EXAM: CT ANGIOGRAPHY HEAD  TECHNIQUE: Multidetector CT  imaging of the head was performed using the standard protocol during bolus administration of intravenous contrast. Multiplanar CT image reconstructions and MIPs were obtained to evaluate the vascular anatomy.  CONTRAST:  50mL OMNIPAQUE IOHEXOL 350 MG/ML SOLN  COMPARISON:  11/13/2014  FINDINGS: Noncontrast head CT is without evidence of acute cortical infarct, intracranial hemorrhage, mass, midline shift, or extra-axial fluid collection. Dilated perivascular space at the inferior right basal ganglia is unchanged. Ventricles and sulci are normal. There is no abnormal enhancement. Visualized orbits are unremarkable. Mastoid air cells are clear. There is subtotal right sphenoid sinus opacification, slightly improved from prior.  Visualized distal vertebral arteries are patent with the left being slightly dominant. PICA and SCA origins are  patent. Basilar artery is patent without stenosis. PCAs are unremarkable. Posterior communicating arteries are not clearly identified.  Internal carotid arteries are patent from skullbase to carotid termini without stenosis. There is a 1.5 cm outpouching from the inferior surface of the distal right supraclinoid ICA. This is somewhat funnel-shaped, however a vessel is not clearly seen arising from the tip of this outpouching. ACAs and MCAs are unremarkable.  Review of the MIP images confirms the above findings.  IMPRESSION: 1. No evidence of acute intracranial abnormality. 2. No major intracranial arterial occlusion or significant stenosis. 3. 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   Electronically Signed   By: Sebastian AcheAllen  Grady   On: 11/18/2014 15:05     EKG Interpretation None      MDM   Final diagnoses:  Headache  Meningitis     43yF with HA. Third ED visit within past week for same and no significant prior headache history. Febrile today. Does have neck pain but says more lateral neck and no meningeal signs on my exam. Prior imaging negative. Doubt meningitis, but cannot exclude. Will LP. Previous CT w/o acute finding. Done just around 6 hours after initial symptom onset. [facility] window is very good. Does not exclude aneurysm though.    CSF results are consistent with meningitis and actually more typical of bacterial versus viral meningitis.  Look well if indeed bacterial meningitis particularly with duration of symptoms. Denies recent antiobiotic use.  No organisms noted on gram stain. Rocephin/Vancomycin. Nonfocal neuro exam, no seizure, etc. Antivirals deferred.   Raeford RazorStephen Garyn Arlotta, MD 11/18/14 [id number]

## 2014-11-18 NOTE — ED Notes (Signed)
Patient transported to CT 

## 2014-11-18 NOTE — ED Notes (Signed)
Called phlebotomy to draw blood cultures. 

## 2014-11-18 NOTE — ED Notes (Signed)
Patient returned from CT

## 2014-11-18 NOTE — H&P (Signed)
[facility] Medicine [facility] Ad[facility] History and Physical Service Pager: [phone]  Patient name: Paula Hoffman Medical record number: [id number] Date of birth: [birth year] Age: 43 y.o. Gender: female  Primary Care Provider: No PCP Per Patient Consultants: none Code Status: full  Chief Complaint: headache  Assessment and Plan: Paula Hoffman is a 43 y.o. female with history of anemia presenting with headache found to have likely bacterial meningitis.   Bacterial meningitis: elevated protein, low glucose, and elevated WBC indicate likely bacterial cause of meningitis, which is likely cause of headache and neck pain. No risk factors for meningitis. No risk factors for listeria. Unlikely to be viral given CSF findings. Neurologically intact on exam. -treat with vanc per pharmacy and ceftriaxone 2 g q 12 hr -f/u BCx and CSF Cx -HSV pcr added -monitor for improvement headache -droplet precautions until IV antibiotics off  Anemia, reports iron deficiency: not on therapy for this. -consider iron studies while inpatient  FEN/GI: clear liquid for now, advance as tolerated Prophylaxis: lovenox  Disposition: admit to med-surg, attending Dr Gwendolyn GrantWalden, discharge pending treatment of meningitis.  History of Present Illness: Paula Hoffman is a 43 y.o. female with history of anemia presenting with headache.   Patient notes onset of headache suddenly at 3 am Sunday morning. Was evaluated in the ED Sunday and received IV medications and had CT head that was normal. She was sent home with tramadol and phenergan for HA, though these were not beneficial. She continued to have headache and returned to the ED on Wednesday. States she received 2 shots in her thighs that helped take the pain away. She went to work Thursday and Friday, though notes her headache continued to get worse until she returned to the ED. HA is frontal and pulsating. She notes pain in the right posterior of her  neck. She felt feverish at home though did not check her temperature. She notes bilateral lower leg weakness. No numbness. Endorses photophobia and phonophobia. No history of meningitis. She denies meningitis exposure, recent infection, recent antibiotics, recent travel, IV drug use, and head trauma. She does not going to a clients house this past week and being around a child with headache. Endorses N/V this past Monday.   In the ED she had a CTA head with no acute abnormalities. LP revealed what is likely bacterial meningitis. Blood cultures were drawn and she was started on vanc and ceftriaxone.   Review Of Systems: Per HPI with the following additions: none Otherwise 12 point review of systems was performed and was unremarkable.  Patient Active Problem List   Diagnosis Date Noted  . Meningitis 11/18/2014   Past Medical History: History reviewed. No pertinent past medical history. Past Surgical History: History reviewed. No pertinent past surgical history. Social History: History  Substance Use Topics  . Smoking status: Never Smoker   . Smokeless tobacco: Not on file  . Alcohol Use: No   Additional social history: none  Please also refer to relevant sections of EMR.  Family History: Family History  Problem Relation Age of Onset  . Hypertension Mother   . Cancer Mother   . Prostate cancer Father    Allergies and Medications: Allergies  Allergen Reactions  . Percocet [Oxycodone-Acetaminophen] Nausea Only and Other (See Comments)    Feels high  . Strawberry Nausea Only    Headache  . Tramadol Nausea Only   No current facility-administered medications on file prior to encounter.   Current Outpatient Prescriptions on File Prior  to Encounter  Medication Sig Dispense Refill  . promethazine (PHENERGAN) 25 MG tablet Take 1 tablet (25 mg total) by mouth every 6 (six) hours as needed for nausea. 20 tablet 0  . amoxicillin (AMOXIL) 875 MG tablet Take 1 tablet (875 mg total) by  mouth 2 (two) times daily. (Patient not taking: Reported on 11/13/2014) 20 tablet 0  . doxycycline (VIBRAMYCIN) 100 MG capsule Take 1 capsule (100 mg total) by mouth 2 (two) times daily. (Patient not taking: Reported on 11/13/2014) 20 capsule 0  . traMADol (ULTRAM) 50 MG tablet Take 1 tablet (50 mg total) by mouth every 6 (six) hours as needed. (Patient not taking: Reported on 11/18/2014) 8 tablet 0    Objective: BP 139/86 mmHg  Pulse 83  Temp(Src) 100.9 F (38.3 C) (Oral)  Resp 20  Ht 5\' 1"  (1.549 m)  Wt 185 lb (83.915 kg)  BMI 34.97 kg/m2  SpO2 100%  LMP 10/27/2014 (Approximate) Exam: General: NAD, laying in bed with blanket on face HEENT: NCAT, MMM, PERRL Cardiovascular: rrr, no murmur appreciated Respiratory: CTAB, no wheezes or rales Abdomen: s, NT, ND Extremities: no edema Skin: no rash noted Neuro: CN 2-12 intact, 5/5 strength in bilateral biceps, triceps, grip, quads, hamstrings, plantar and dorsiflexion, sensation to light touch intact in bilateral UE and LE Discomfort on ROM of neck, negative brudzinski's   Labs and Imaging: CBC BMET   Recent Labs Lab 11/18/14 1330  WBC 10.5  HGB 11.1*  HCT 33.6*  PLT 390    Recent Labs Lab 11/18/14 1330  NA 134*  K 3.4*  CL 97  CO2 24  BUN 6  CREATININE 0.72  GLUCOSE 99  CALCIUM 8.9     CSF gram stain no organisms CSF cell count WBC 2900, Neutrophils 86%, RBC 7 CSF glucose 22 CSF protein 126   Glori LuisEric G Donnita Farina, MD 11/18/2014, 5:23 PM PGY-3, [facility] FPTS Intern pager: [phone], text pages welcome

## 2014-11-18 NOTE — ED Notes (Signed)
Lab called with results of CSF glucose 22 LOW. Reported to EDP.

## 2014-11-19 LAB — BASIC METABOLIC PANEL
Anion gap: 15 (ref 5–15)
BUN: 8 mg/dL (ref 6–23)
CALCIUM: 8.8 mg/dL (ref 8.4–10.5)
CO2: 21 meq/L (ref 19–32)
Chloride: 102 mEq/L (ref 96–112)
Creatinine, Ser: 0.63 mg/dL (ref 0.50–1.10)
GFR calc Af Amer: >90 mL/min (ref >90)
GFR calc non Af Amer: >90 mL/min (ref >90)
GLUCOSE: 111 mg/dL — AB (ref 70–99)
Potassium: 3.6 mEq/L — ABNORMAL LOW (ref 3.7–5.3)
SODIUM: 138 meq/L (ref 137–147)

## 2014-11-19 LAB — CBC
HEMATOCRIT: 31.5 % — AB (ref 36.0–46.0)
Hemoglobin: 10.5 g/dL — ABNORMAL LOW (ref 12.0–15.0)
MCH: 28.7 pg (ref 26.0–34.0)
MCHC: 33.3 g/dL (ref 30.0–36.0)
MCV: 86.1 fL (ref 78.0–100.0)
Platelets: 364 10*3/uL (ref 150–400)
RBC: 3.66 MIL/uL — ABNORMAL LOW (ref 3.87–5.11)
RDW: 13 % (ref 11.5–15.5)
WBC: 10.7 10*3/uL — AB (ref 4.0–10.5)

## 2014-11-19 NOTE — Progress Notes (Signed)
Family Medicine Teaching Service Daily Progress Note Intern Pager: [phone]  Patient name: Paula Hoffman Medical record number: [id number] Date of birth: 1971/08/17 Age: 43 y.o. Gender: female  Primary Care Provider: No PCP Per Patient Consultants: none Code Status: full  Pt Overview and Major Events to Date:  12/12 admitted for treatment of bacterial meningitis  Assessment and Plan: Paula PainLailtrice B Elmquist is a 43 y.o. female with history of anemia presenting with headache found to have likely bacterial meningitis.   Bacterial meningitis: elevated protein, low glucose, and elevated WBC indicate likely bacterial cause of meningitis, which is likely cause of headache and neck pain. No risk factors for meningitis. No risk factors for listeria. Unlikely to be viral given CSF findings. Neurologically intact on exam. -continue vanc per pharmacy and ceftriaxone 2 g q 12 hr -f/u BCx and CSF Cx - CSF gram stain with no organisms -f/u HSV pcr -droplet precautions until IV antibiotics off  Anemia, reports iron deficiency: not on therapy for this. -will need iron studies as an outpatient  FEN/GI: regular diet Prophylaxis: lovenox  Disposition: discharge pending BCx and CSF Cx results  Subjective:  Feels 100% better. Notes can move neck with no pain.   Objective: Temp:  [98.8 F (37.1 C)-100.9 F (38.3 C)] 98.8 F (37.1 C) (12/13 0522) Pulse Rate:  [72-96] 78 (12/13 0522) Resp:  [18-20] 19 (12/13 0522) BP: (122-143)/(68-88) 131/74 mmHg (12/13 0522) SpO2:  [98 %-100 %] 100 % (12/13 0522) Weight:  [177 lb 3.2 oz (80.377 kg)-185 lb (83.915 kg)] 177 lb 3.2 oz (80.377 kg) (12/12 2119) Physical Exam: General: sitting up in bed, eyes open, moving head freely Cardiovascular: rrr, no murmur Respiratory: CTAB, no wheezes Extremities: no edema Neuro: CN 2-12 intact, 5/5 strength in bilateral biceps, triceps, grip, quads, hamstrings, plantar and dorsiflexion, sensation to light touch intact in  bilateral UE and LE  Laboratory:  Recent Labs Lab 11/13/14 0918 11/13/14 0946 11/18/14 1330 11/19/14 0556  WBC 9.8  --  10.5 10.7*  HGB 11.1* 11.9* 11.1* 10.5*  HCT 33.0* 35.0* 33.6* 31.5*  PLT 374  --  390 364    Recent Labs Lab 11/13/14 0946 11/18/14 1330 11/19/14 0556  NA 137 134* 138  K 3.3* 3.4* 3.6*  CL 100 97 102  CO2  --  24 21  BUN 3* 6 8  CREATININE 0.60 0.72 0.63  CALCIUM  --  8.9 8.8  GLUCOSE 118* 99 111*    CSF gram stain no organisms CSF cell count WBC 2900, Neutrophils 86%, RBC 7 CSF glucose 22 CSF protein 126 CSF culture gram stain no organisms seen  Ct Angio Head W/cm &/or Wo Cm  11/18/2014   CLINICAL DATA:  Headache for 5 days, worse yesterday. Pain located in the frontal area and behind the eyes. Light sensitivity.  EXAM: CT ANGIOGRAPHY HEAD  TECHNIQUE: Multidetector CT imaging of the head was performed using the standard protocol during bolus administration of intravenous contrast. Multiplanar CT image reconstructions and MIPs were obtained to evaluate the vascular anatomy.  CONTRAST:  50mL OMNIPAQUE IOHEXOL 350 MG/ML SOLN  COMPARISON:  11/13/2014  FINDINGS: Noncontrast head CT is without evidence of acute cortical infarct, intracranial hemorrhage, mass, midline shift, or extra-axial fluid collection. Dilated perivascular space at the inferior right basal ganglia is unchanged. Ventricles and sulci are normal. There is no abnormal enhancement. Visualized orbits are unremarkable. Mastoid air cells are clear. There is subtotal right sphenoid sinus opacification, slightly improved from prior.  Visualized distal vertebral  arteries are patent with the left being slightly dominant. PICA and SCA origins are patent. Basilar artery is patent without stenosis. PCAs are unremarkable. Posterior communicating arteries are not clearly identified.  Internal carotid arteries are patent from skullbase to carotid termini without stenosis. There is a 1.5 cm outpouching from the  inferior surface of the distal right supraclinoid ICA. This is somewhat funnel-shaped, however a vessel is not clearly seen arising from the tip of this outpouching. ACAs and MCAs are unremarkable.  Review of the MIP images confirms the above findings.  IMPRESSION: 1. No evidence of acute intracranial abnormality. 2. No major intracranial arterial occlusion or significant stenosis. 3. 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   Electronically Signed   By: Sebastian AcheAllen  Grady   On: 11/18/2014 15:05    Glori LuisEric G Laniah Grimm, MD 11/19/2014, 7:05 AM PGY-3, [facility] FPTS Intern pager: [phone], text pages welcome

## 2014-11-20 LAB — HERPES SIMPLEX VIRUS(HSV) DNA BY PCR
HSV 1 DNA: NOT DETECTED
HSV 2 DNA: NOT DETECTED

## 2014-11-20 LAB — VANCOMYCIN, TROUGH: VANCOMYCIN TR: 17.6 ug/mL (ref 10.0–20.0)

## 2014-11-20 LAB — CBC
HCT: 31.2 % — ABNORMAL LOW (ref 36.0–46.0)
Hemoglobin: 10.5 g/dL — ABNORMAL LOW (ref 12.0–15.0)
MCH: 29.1 pg (ref 26.0–34.0)
MCHC: 33.7 g/dL (ref 30.0–36.0)
MCV: 86.4 fL (ref 78.0–100.0)
PLATELETS: 346 10*3/uL (ref 150–400)
RBC: 3.61 MIL/uL — AB (ref 3.87–5.11)
RDW: 13.2 % (ref 11.5–15.5)
WBC: 7.1 10*3/uL (ref 4.0–10.5)

## 2014-11-20 LAB — BASIC METABOLIC PANEL
Anion gap: 11 (ref 5–15)
BUN: 9 mg/dL (ref 6–23)
CHLORIDE: 105 meq/L (ref 96–112)
CO2: 23 meq/L (ref 19–32)
CREATININE: 0.72 mg/dL (ref 0.50–1.10)
Calcium: 8.4 mg/dL (ref 8.4–10.5)
GFR calc non Af Amer: >90 mL/min (ref >90)
Glucose, Bld: 89 mg/dL (ref 70–99)
POTASSIUM: 3.7 meq/L (ref 3.7–5.3)
SODIUM: 139 meq/L (ref 137–147)

## 2014-11-20 LAB — TYPE AND SCREEN
ABO/RH(D): O POS
ANTIBODY SCREEN: NEGATIVE

## 2014-11-20 MED ORDER — ACETAMINOPHEN 325 MG PO TABS
650.0000 mg | ORAL_TABLET | Freq: Four times a day (QID) | ORAL | Status: DC | PRN
  Filled 2014-11-20: qty 2

## 2014-11-20 MED ORDER — ACETAMINOPHEN 325 MG PO TABS: 650.0000 mg | ORAL_TABLET | Freq: Four times a day (QID) | ORAL | Status: DC | PRN

## 2014-11-20 MED ORDER — DEXTROSE-NACL 5-0.9 % IV SOLN
INTRAVENOUS | Status: DC
  Administered 2014-11-20: 23:00:00 via INTRAVENOUS

## 2014-11-20 NOTE — Consult Note (Addendum)
Paula Hoffman, Paula Hoffman [id number] [birth year] Paula GrimJeffrey H Walden, MD  Reason for Consult: right CSF rhinorrhea, right chronic sphenoid opacification/sinusitis, bacterial meningitis  HPI: 43yo AAF admitted for bacterial meningitis. CT head and CT angio show persistent right sphenoid opacification. Patient also gives history of right clear rhinorrhea for several months when she leans forward, treated previously as allergic rhinitis.  Allergies:  Allergies  Allergen Reactions  . Percocet [Oxycodone-Acetaminophen] Nausea Only and Other (See Comments)    Feels high  . Strawberry Nausea Only    Headache  . Tramadol Nausea Only    ROS: + for clear rhinorrhea right, headaches, fevers, otherwise negative x 12 systems except per HPI.  PMH: History reviewed. No pertinent past medical history.  FH:  Family History  Problem Relation Age of Onset  . Hypertension Mother   . Cancer Mother   . Prostate cancer Father     SH:  History   Social History  . Marital Status: Single    Spouse Name: [name]    Number of Children: [name]  . Years of Education: [name]   Occupational History  . Not on file.   Social History Main Topics  . Smoking status: Never Smoker   . Smokeless tobacco: Not on file  . Alcohol Use: No  . Drug Use: No  . Sexual Activity: Not on file   Other Topics Concern  . Not on file   Social History Narrative    PSH: History reviewed. No pertinent past surgical history.  Physical  Exam: *CN 2-12 grossly intact and symmetric. EAC/TMs normal BL. Oral cavity, lips, gums, ororpharynx normal with no masses or lesions. Skin warm and dry. Nasal cavity with clear CSF dripping from right nostril with reservoir test, midline septum, grossly normal turbinates.. External nose and ears without masses or lesions. EOMI, PERRLA. Neck supple with no masses or lesions.    A/P: positive right reservoir test with meningitis, clear rhinorrhea, opacified right sphenoid consistent with right sphenoid  vs. Cribriform CSF leak. Recommended bilateral functional endoscopic sinus surgery with repair CSF leak with abdominal fat graft and middle turbinate mucosal graft vs. Nasoseptal flap. Discussed risks including anesthesia risks, bleeding, infection, orbital injury, brain or skull base injury. Informed written consent signed witnessed and on chart. Will post for tomorrow and make NPO after midnight.  Posted for 0730 on 11/21/14 for sinus surgery and fat graft/repair of CSF leak. Consent on chart.   Melvenia BeamGore, Audra Bellard 11/20/2014 6:12 PM

## 2014-11-20 NOTE — Progress Notes (Signed)
CARE MANAGEMENT NOTE 11/20/2014  Patient:  Paula Hoffman,Paula Hoffman   Account Number:  [id number]  Date Initiated:  11/20/2014  Documentation initiated by:  Jiles CrockerHANDLER,Lewanda Perea  Subjective/Objective Assessment:   ADMITTED WITH MENINGITIS     Action/Plan:   CM FOLLOWING FOR DCP   Anticipated DC Date:  11/24/2014   Anticipated DC Plan:  HOME/SELF CARE    DC Planning Services  CM consult         Status of service:  In process, will continue to follow Medicare Important Message given?   (If response is "NO", the following Medicare IM given date fields will be blank)  Per UR Regulation:  Reviewed for med. necessity/level of care/duration of stay  Comments:  12/14/2015Abelino Derrick- Hoffman Loic Hobin RN,BSN,MHA [phone]

## 2014-11-20 NOTE — Progress Notes (Signed)
ANTIBIOTIC CONSULT NOTE - FOLLOW UP  Pharmacy Consult for Vancomycin Indication: Meningitis  Allergies  Allergen Reactions  . Percocet [Oxycodone-Acetaminophen] Nausea Only and Other (See Comments)    Feels high  . Strawberry Nausea Only    Headache  . Tramadol Nausea Only    Patient Measurements: Height: 5\' 1"  (154.9 cm) Weight: 177 lb 3.2 oz (80.377 kg) IBW/kg (Calculated) : 47.8 Adjusted Body Weight:   Vital Signs: Temp: 98.7 F (37.1 C) (12/14 1827) Temp Source: Oral (12/14 1827) BP: 114/65 mmHg (12/14 1827) Pulse Rate: 91 (12/14 1827) Intake/Output from previous day: 12/13 0701 - 12/14 0700 In: 1330 [P.O.:680; IV Piggyback:650] Out: 3 [Urine:2; Stool:1] Intake/Output from this shift:    Labs:  Recent Labs  11/18/14 1330 11/19/14 0556 11/20/14 0748  WBC 10.5 10.7* 7.1  HGB 11.1* 10.5* 10.5*  PLT 390 364 346  CREATININE 0.72 0.63 0.72   Estimated Creatinine Clearance: 86.1 mL/min (by C-G formula based on Cr of 0.72).  Recent Labs  11/20/14 1904  VANCOTROUGH 17.6     Microbiology: Recent Results (from the past 720 hour(s))  CSF culture     Status: None (Preliminary result)   Collection Time: 11/18/14  3:30 PM  Result Value Ref Range Status   Specimen Description CSF  Final   Special Requests NONE  Final   Gram Stain   Final    CYTOSPIN WBC PRESENT,BOTH PMN AND MONONUCLEAR NO ORGANISMS SEEN Performed at [facility] [place] Performed at [facility]    Culture   Final    NO GROWTH 1 DAY Performed at [facility]    Report Status PENDING  Incomplete  Gram stain - STAT with CSF culture     Status: None   Collection Time: 11/18/14  3:30 PM  Result Value Ref Range Status   Specimen Description CSF  Final   Special Requests NONE  Final   Gram Stain   Final    CYTOSPIN WBC PRESENT,BOTH PMN AND MONONUCLEAR NO ORGANISMS SEEN    Report Status 11/18/2014 FINAL  Final  Culture, blood (routine x 2)     Status: None (Preliminary  result)   Collection Time: 11/18/14  5:08 PM  Result Value Ref Range Status   Specimen Description BLOOD LEFT ARM  Final   Special Requests BOTTLES DRAWN AEROBIC ONLY 10 CC  Final   Culture  Setup Time   Final    11/19/2014 04:07 Performed at [facility]    Culture   Final           BLOOD CULTURE RECEIVED NO GROWTH TO DATE CULTURE WILL BE HELD FOR 5 DAYS BEFORE ISSUING A FINAL NEGATIVE REPORT Performed at [facility]    Report Status PENDING  Incomplete  Culture, blood (routine x 2)     Status: None (Preliminary result)   Collection Time: 11/18/14  5:16 PM  Result Value Ref Range Status   Specimen Description BLOOD LEFT ARM  Final   Special Requests BOTTLES DRAWN AEROBIC AND ANAEROBIC 10 CC  Final   Culture  Setup Time   Final    11/19/2014 04:08 Performed at [facility]    Culture   Final           BLOOD CULTURE RECEIVED NO GROWTH TO DATE CULTURE WILL BE HELD FOR 5 DAYS BEFORE ISSUING A FINAL NEGATIVE REPORT Performed at [facility]    Report Status PENDING  Incomplete    Anti-infectives    Start  Dose/Rate Route Frequency Ordered Stop   11/19/14 0500  cefTRIAXone (ROCEPHIN) 2 g in dextrose 5 % 50 mL IVPB     2 g100 mL/hr over 30 Minutes Intravenous Every 12 hours 11/18/14 1745     11/19/14 0200  vancomycin (VANCOCIN) IVPB 1000 mg/200 mL premix     1,000 mg200 mL/hr over 60 Minutes Intravenous Every 8 hours 11/18/14 1626     11/18/14 2200  cefTRIAXone (ROCEPHIN) 2 g in dextrose 5 % 50 mL IVPB  Status:  Discontinued     2 g100 mL/hr over 30 Minutes Intravenous Every 12 hours 11/18/14 1744 11/18/14 1745   11/18/14 1630  vancomycin (VANCOCIN) 1,250 mg in sodium chloride 0.9 % 250 mL IVPB     1,250 mg166.7 mL/hr over 90 Minutes Intravenous  Once 11/18/14 1625 11/18/14 1918   11/18/14 1615  cefTRIAXone (ROCEPHIN) 2 g in dextrose 5 % 50 mL IVPB     2 g100 mL/hr over 30 Minutes Intravenous  Once 11/18/14 1615 11/18/14 1756       Assessment: Vanc-Rx, CTX-MD for bacterial meningitis; afeb, WBC down to 7.1; Wt 80, Crcl 86. Vancomycin trough 17.6 in goal range. - LP (12/12): elevated protein and WBC, low glucose, high neutrophils  Vanc 12/12 >> Ceftriaxone 12/12>>  12/12 CSF: no organisms seen 12/12 BCx: NGTD  Goal of Therapy:  Vancomycin trough level 15-20 mcg/ml  Plan:  Continue Vancomycin 1g IV q8hr.   Taniah Reinecke S. Merilynn Finlandobertson, PharmD, BCPS Clinical Staff Pharmacist Pager [phone]  Misty Stanleyobertson, Reida Hem Stillinger 11/20/2014,8:40 PM

## 2014-11-20 NOTE — Anesthesia Preprocedure Evaluation (Addendum)
Anesthesia Evaluation  Patient identified by MRN, date of birth, ID band Patient awake    Reviewed: Allergy & Precautions, H&P , NPO status , reviewed documented beta blocker date and time   Airway Mallampati: II   Neck ROM: Full    Dental  (+) Teeth Intact, Dental Advisory Given   Pulmonary  breath sounds clear to auscultation        Cardiovascular negative cardio ROS  Rhythm:Regular     Neuro/Psych CSF rhinorrhea, menigitis    GI/Hepatic negative GI ROS, Neg liver ROS,   Endo/Other  negative endocrine ROS  Renal/GU negative Renal ROS     Musculoskeletal   Abdominal (+)  Abdomen: soft.    Peds  Hematology  (+) anemia , 10/31 H/H   Anesthesia Other Findings   Reproductive/Obstetrics                           Anesthesia Physical Anesthesia Plan  ASA: II  Anesthesia Plan: General   Post-op Pain Management:    Induction: Intravenous  Airway Management Planned: Oral ETT  Additional Equipment:   Intra-op Plan:   Post-operative Plan: Extubation in OR  Informed Consent: I have reviewed the patients History and Physical, chart, labs and discussed the procedure including the risks, benefits and alternatives for the proposed anesthesia with the patient or authorized representative who has indicated his/her understanding and acceptance.     Plan Discussed with:   Anesthesia Plan Comments:         Anesthesia Quick Evaluation

## 2014-11-20 NOTE — Progress Notes (Signed)
Family Medicine Teaching Service Daily Progress Note Intern Pager: [phone]  Patient name: Paula Hoffman Mccleery Medical record number: [id number] Date of birth: July 18, 1971 Age: 43 y.o. Gender: female  Primary Care Provider: No PCP Per Patient Consultants: none Code Status: full  Pt Overview and Major Events to Date:  12/12 admitted for treatment of bacterial meningitis  Assessment and Plan: Paula Hoffman Handel is a 43 y.o. female with history of anemia presenting with headache found to have likely bacterial meningitis.   Bacterial meningitis: elevated protein, low glucose, and elevated WBC indicate likely bacterial cause of meningitis, which is likely cause of headache and neck pain. No risk factors for meningitis. No risk factors for listeria. Unlikely to be viral given CSF findings. Neurologically intact on exam. - f/u BCx: NGTD - CSF Cx: NGTD - CSF gram stain with no organisms -f/u HSV pcr: Pending - CTX 2g BID, Vancomycin 1g TID (per pharm) -droplet precautions until IV antibiotics off  Anemia, reports iron deficiency: not on therapy for this. -will need iron studies as an outpatient  FEN/GI: regular diet Prophylaxis: lovenox  Disposition: discharge pending BCx and CSF Cx results  Subjective:  Feeling better. Mild HA this AM. Says its the first since treatment started. Denies any other symptoms at this time. Inquiring about DC.  Objective: Temp:  [98 F (36.7 C)-99.4 F (37.4 C)] 98.8 F (37.1 C) (12/14 0602) Pulse Rate:  [75-92] 75 (12/14 0602) Resp:  [17-18] 18 (12/14 0602) BP: (99-127)/(61-81) 111/61 mmHg (12/14 0602) SpO2:  [99 %-100 %] 100 % (12/14 0602) Physical Exam: General: sitting up in bed, NAD; pleasant  HEENT: PERRLA, EOMI, neck with full ROM Cardiovascular: rrr, no murmur Respiratory: CTAB, no wheezes Extremities: no edema, peripheral pulses intact.  Neuro: CN 2-12 intact, 5/5 strength throughout bilaterally. Sensation intact. Gait normal.    Laboratory:  Recent Labs Lab 11/18/14 1330 11/19/14 0556 11/20/14 0748  WBC 10.5 10.7* 7.1  HGB 11.1* 10.5* 10.5*  HCT 33.6* 31.5* 31.2*  PLT 390 364 346    Recent Labs Lab 11/18/14 1330 11/19/14 0556 11/20/14 0748  NA 134* 138 139  K 3.4* 3.6* 3.7  CL 97 102 105  CO2 24 21 23   BUN 6 8 9   CREATININE 0.72 0.63 0.72  CALCIUM 8.9 8.8 8.4  GLUCOSE 99 111* 89    CSF gram stain no organisms CSF cell count WBC 2900, Neutrophils 86%, RBC 7 CSF glucose 22 CSF protein 126 CSF culture gram stain no organisms seen  Ct Angio Head W/cm &/or Wo Cm  11/18/2014   CLINICAL DATA:  Headache for 5 days, worse yesterday. Pain located in the frontal area and behind the eyes. Light sensitivity.  EXAM: CT ANGIOGRAPHY HEAD  TECHNIQUE: Multidetector CT imaging of the head was performed using the standard protocol during bolus administration of intravenous contrast. Multiplanar CT image reconstructions and MIPs were obtained to evaluate the vascular anatomy.  CONTRAST:  50mL OMNIPAQUE IOHEXOL 350 MG/ML SOLN  COMPARISON:  11/13/2014  FINDINGS: Noncontrast head CT is without evidence of acute cortical infarct, intracranial hemorrhage, mass, midline shift, or extra-axial fluid collection. Dilated perivascular space at the inferior right basal ganglia is unchanged. Ventricles and sulci are normal. There is no abnormal enhancement. Visualized orbits are unremarkable. Mastoid air cells are clear. There is subtotal right sphenoid sinus opacification, slightly improved from prior.  Visualized distal vertebral arteries are patent with the left being slightly dominant. PICA and SCA origins are patent. Basilar artery is patent without stenosis. PCAs are  unremarkable. Posterior communicating arteries are not clearly identified.  Internal carotid arteries are patent from skullbase to carotid termini without stenosis. There is a 1.5 cm outpouching from the inferior surface of the distal right supraclinoid ICA. This is  somewhat funnel-shaped, however a vessel is not clearly seen arising from the tip of this outpouching. ACAs and MCAs are unremarkable.  Review of the MIP images confirms the above findings.  IMPRESSION: 1. No evidence of acute intracranial abnormality. 2. No major intracranial arterial occlusion or significant stenosis. 3. 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   Electronically Signed   By: Sebastian AcheAllen  Grady   On: 11/18/2014 15:05    Kathee DeltonIan D Sophiya Morello, MD 11/20/2014, 8:48 AM PGY-1, [facility] FPTS Intern pager: [phone], text pages welcome

## 2014-11-21 ENCOUNTER — Inpatient Hospital Stay (HOSPITAL_COMMUNITY): Payer: 59 | Admitting: Anesthesiology

## 2014-11-21 ENCOUNTER — Encounter (HOSPITAL_COMMUNITY)
Admission: EM | Disposition: A | Discharge status: Home Health | Payer: Self-pay | Source: Home / Self Care | Attending: Family Medicine

## 2014-11-21 DIAGNOSIS — G960 Cerebrospinal fluid leak: Principal | ICD-10-CM

## 2014-11-21 DIAGNOSIS — G9601 Cranial cerebrospinal fluid leak, spontaneous: Secondary | ICD-10-CM | POA: Diagnosis present

## 2014-11-21 HISTORY — PX: ABDOMINAL FAT GRAPH: SHX5709

## 2014-11-21 HISTORY — PX: SINUS ENDO W/FUSION: SHX777

## 2014-11-21 LAB — ABO/RH: ABO/RH(D): O POS

## 2014-11-21 LAB — PATHOLOGIST SMEAR REVIEW

## 2014-11-21 SURGERY — Surgical Case
Anesthesia: *Unknown

## 2014-11-21 SURGERY — SINUS SURGERY, ENDOSCOPIC, USING COMPUTER-ASSISTED NAVIGATION
Anesthesia: General

## 2014-11-21 MED ORDER — PROMETHAZINE HCL 25 MG/ML IJ SOLN: 6.2500 mg | INTRAMUSCULAR | Status: DC | PRN

## 2014-11-21 MED ORDER — 0.9 % SODIUM CHLORIDE (POUR BTL) OPTIME
TOPICAL | Status: DC | PRN
  Administered 2014-11-21: 1000 mL

## 2014-11-21 MED ORDER — LIDOCAINE-EPINEPHRINE 1 %-1:100000 IJ SOLN
INTRAMUSCULAR | Status: DC | PRN
  Administered 2014-11-21: 20 mL

## 2014-11-21 MED ORDER — STERILE WATER FOR IRRIGATION IR SOLN
Status: DC | PRN
  Administered 2014-11-21: 1000 mL

## 2014-11-21 MED ORDER — LIDOCAINE-EPINEPHRINE 1 %-1:100000 IJ SOLN
INTRAMUSCULAR | Status: AC
  Filled 2014-11-21: qty 1

## 2014-11-21 MED ORDER — PROPOFOL 10 MG/ML IV BOLUS
INTRAVENOUS | Status: DC | PRN
  Administered 2014-11-21: 200 mg via INTRAVENOUS

## 2014-11-21 MED ORDER — ONDANSETRON HCL 4 MG/2ML IJ SOLN: 4.0000 mg | Freq: Four times a day (QID) | INTRAMUSCULAR | Status: DC | PRN

## 2014-11-21 MED ORDER — OXYMETAZOLINE HCL 0.05 % NA SOLN
NASAL | Status: DC | PRN
  Administered 2014-11-21 (×2): 1 via NASAL

## 2014-11-21 MED ORDER — OXYMETAZOLINE HCL 0.05 % NA SOLN
NASAL | Status: AC
  Filled 2014-11-21: qty 15

## 2014-11-21 MED ORDER — ONDANSETRON HCL 4 MG/2ML IJ SOLN
INTRAMUSCULAR | Status: DC | PRN
  Administered 2014-11-21: 4 mg via INTRAVENOUS

## 2014-11-21 MED ORDER — LIDOCAINE HCL (CARDIAC) 20 MG/ML IV SOLN
INTRAVENOUS | Status: DC | PRN
  Administered 2014-11-21: 100 mg via INTRAVENOUS

## 2014-11-21 MED ORDER — FENTANYL CITRATE 0.05 MG/ML IJ SOLN
INTRAMUSCULAR | Status: AC
  Filled 2014-11-21: qty 2

## 2014-11-21 MED ORDER — IBUPROFEN 400 MG PO TABS
400.0000 mg | ORAL_TABLET | ORAL | Status: DC | PRN
  Administered 2014-11-22 – 2014-11-23 (×4): 400 mg via ORAL
  Filled 2014-11-21: qty 2
  Filled 2014-11-21 (×4): qty 1
  Filled 2014-11-21: qty 2
  Filled 2014-11-21: qty 1

## 2014-11-21 MED ORDER — ACETAMINOPHEN 325 MG PO TABS
650.0000 mg | ORAL_TABLET | ORAL | Status: DC | PRN
  Administered 2014-11-21: 650 mg via ORAL
  Filled 2014-11-21: qty 2

## 2014-11-21 MED ORDER — HEMOSTATIC AGENTS (NO CHARGE) OPTIME
TOPICAL | Status: DC | PRN
  Administered 2014-11-21: 1 via TOPICAL

## 2014-11-21 MED ORDER — EPHEDRINE SULFATE 50 MG/ML IJ SOLN
INTRAMUSCULAR | Status: AC
  Filled 2014-11-21: qty 1

## 2014-11-21 MED ORDER — MEPERIDINE HCL 25 MG/ML IJ SOLN: 6.2500 mg | INTRAMUSCULAR | Status: DC | PRN

## 2014-11-21 MED ORDER — FLUORESCEIN SODIUM 1 MG OP STRP
ORAL_STRIP | OPHTHALMIC | Status: AC
  Filled 2014-11-21: qty 2

## 2014-11-21 MED ORDER — ROCURONIUM BROMIDE 100 MG/10ML IV SOLN
INTRAVENOUS | Status: DC | PRN
  Administered 2014-11-21 (×2): 10 mg via INTRAVENOUS
  Administered 2014-11-21: 35 mg via INTRAVENOUS
  Administered 2014-11-21: 15 mg via INTRAVENOUS

## 2014-11-21 MED ORDER — GLYCOPYRROLATE 0.2 MG/ML IJ SOLN
INTRAMUSCULAR | Status: DC | PRN
  Administered 2014-11-21: .8 mg via INTRAVENOUS

## 2014-11-21 MED ORDER — FENTANYL CITRATE 0.05 MG/ML IJ SOLN
INTRAMUSCULAR | Status: AC
  Filled 2014-11-21: qty 5

## 2014-11-21 MED ORDER — SODIUM CHLORIDE 0.9 % IR SOLN
Status: DC | PRN
  Administered 2014-11-21 (×2): 1000 mL

## 2014-11-21 MED ORDER — ARTIFICIAL TEARS OP OINT
TOPICAL_OINTMENT | OPHTHALMIC | Status: AC
  Filled 2014-11-21: qty 3.5

## 2014-11-21 MED ORDER — PNEUMOCOCCAL VAC POLYVALENT 25 MCG/0.5ML IJ INJ
0.5000 mL | INJECTION | INTRAMUSCULAR | Status: AC
  Administered 2014-11-22: 0.5 mL via INTRAMUSCULAR
  Filled 2014-11-21: qty 0.5

## 2014-11-21 MED ORDER — PROPOFOL 10 MG/ML IV BOLUS
INTRAVENOUS | Status: AC
  Filled 2014-11-21: qty 20

## 2014-11-21 MED ORDER — FENTANYL CITRATE 0.05 MG/ML IJ SOLN
25.0000 ug | INTRAMUSCULAR | Status: DC | PRN
  Administered 2014-11-21 (×2): 50 ug via INTRAVENOUS

## 2014-11-21 MED ORDER — PHENYLEPHRINE 40 MCG/ML (10ML) SYRINGE FOR IV PUSH (FOR BLOOD PRESSURE SUPPORT)
PREFILLED_SYRINGE | INTRAVENOUS | Status: AC
  Filled 2014-11-21: qty 10

## 2014-11-21 MED ORDER — NEOSTIGMINE METHYLSULFATE 10 MG/10ML IV SOLN
INTRAVENOUS | Status: DC | PRN
  Administered 2014-11-21: 5 mg via INTRAVENOUS

## 2014-11-21 MED ORDER — SUCCINYLCHOLINE CHLORIDE 20 MG/ML IJ SOLN
INTRAMUSCULAR | Status: AC
  Filled 2014-11-21: qty 1

## 2014-11-21 MED ORDER — FENTANYL CITRATE 0.05 MG/ML IJ SOLN
INTRAMUSCULAR | Status: DC | PRN
  Administered 2014-11-21: 100 ug via INTRAVENOUS
  Administered 2014-11-21 (×5): 50 ug via INTRAVENOUS
  Administered 2014-11-21: 100 ug via INTRAVENOUS

## 2014-11-21 MED ORDER — MIDAZOLAM HCL 5 MG/5ML IJ SOLN
INTRAMUSCULAR | Status: DC | PRN
  Administered 2014-11-21: 2 mg via INTRAVENOUS

## 2014-11-21 MED ORDER — MUPIROCIN 2 % EX OINT
TOPICAL_OINTMENT | CUTANEOUS | Status: AC
  Filled 2014-11-21: qty 22

## 2014-11-21 MED ORDER — LIDOCAINE HCL (CARDIAC) 20 MG/ML IV SOLN
INTRAVENOUS | Status: AC
  Filled 2014-11-21: qty 5

## 2014-11-21 MED ORDER — MENINGOCOCCAL VAC A,C,Y,W-135 ~~LOC~~ INJ
0.5000 mL | INJECTION | Freq: Once | SUBCUTANEOUS | Status: AC
  Administered 2014-11-23: 0.5 mL via SUBCUTANEOUS
  Filled 2014-11-21: qty 0.5

## 2014-11-21 MED ORDER — STERILE WATER FOR INJECTION IJ SOLN
INTRAMUSCULAR | Status: AC
Start: 2014-11-21 — End: 2014-11-21
  Filled 2014-11-21: qty 10

## 2014-11-21 MED ORDER — PHENYLEPHRINE HCL 10 MG/ML IJ SOLN
INTRAMUSCULAR | Status: DC | PRN
  Administered 2014-11-21 (×3): 40 ug via INTRAVENOUS

## 2014-11-21 MED ORDER — LACTATED RINGERS IV SOLN
INTRAVENOUS | Status: DC | PRN
  Administered 2014-11-21 (×2): via INTRAVENOUS

## 2014-11-21 MED ORDER — DOCUSATE SODIUM 100 MG PO CAPS: 100.0000 mg | ORAL_CAPSULE | Freq: Two times a day (BID) | ORAL | Status: DC | PRN

## 2014-11-21 MED ORDER — DEXAMETHASONE SODIUM PHOSPHATE 4 MG/ML IJ SOLN
INTRAMUSCULAR | Status: DC | PRN
  Administered 2014-11-21: 4 mg via INTRAVENOUS

## 2014-11-21 MED ORDER — ARTIFICIAL TEARS OP OINT
TOPICAL_OINTMENT | OPHTHALMIC | Status: DC | PRN
  Administered 2014-11-21: 1 via OPHTHALMIC

## 2014-11-21 MED ORDER — MIDAZOLAM HCL 2 MG/2ML IJ SOLN
INTRAMUSCULAR | Status: AC
  Filled 2014-11-21: qty 2

## 2014-11-21 MED ORDER — FLUORESCEIN SODIUM 1 MG OP STRP
ORAL_STRIP | OPHTHALMIC | Status: DC | PRN
  Administered 2014-11-21: 1

## 2014-11-21 MED ORDER — ROCURONIUM BROMIDE 50 MG/5ML IV SOLN
INTRAVENOUS | Status: AC
  Filled 2014-11-21: qty 1

## 2014-11-21 MED ORDER — ONDANSETRON HCL 4 MG/2ML IJ SOLN
INTRAMUSCULAR | Status: AC
  Filled 2014-11-21: qty 2

## 2014-11-21 MED ORDER — PHENOL 1.4 % MT LIQD: 2.0000 | Freq: Three times a day (TID) | OROMUCOSAL | Status: DC | PRN

## 2014-11-21 SURGICAL SUPPLY — 63 items
ALLODERM MED 3X7 (Tissue) ×3 IMPLANT
APL SKNCLS STERI-STRIP NONHPOA (GAUZE/BANDAGES/DRESSINGS) ×2
ATTRACTOMAT 16X20 MAGNETIC DRP (DRAPES) IMPLANT
BENZOIN TINCTURE PRP APPL 2/3 (GAUZE/BANDAGES/DRESSINGS) ×3 IMPLANT
BLADE RAD 40 CVD SINUS 4MM (BLADE) IMPLANT
BLADE RAD60 ROTATE M4 4 5PK (BLADE) ×3 IMPLANT
BLADE ROTATE RAD 40 4 M4 (BLADE) IMPLANT
BLADE ROTATE TRICUT 4X13 M4 (BLADE) ×3 IMPLANT
BUR DIAMOND 13X5 70D (BURR) IMPLANT
BUR DIAMOND CURV 15X5 15D (BURR) IMPLANT
CANISTER SUCTION 2500CC (MISCELLANEOUS) ×3 IMPLANT
COAGULATOR SUCT SWTCH 10FR 6 (ELECTROSURGICAL) ×3 IMPLANT
CONT SPEC 4OZ CLIKSEAL STRL BL (MISCELLANEOUS) IMPLANT
CORDS BIPOLAR (ELECTRODE) ×3 IMPLANT
CRADLE DONUT ADULT HEAD (MISCELLANEOUS) IMPLANT
DECANTER SPIKE VIAL GLASS SM (MISCELLANEOUS) ×3 IMPLANT
DRAPE PROXIMA HALF (DRAPES) ×3 IMPLANT
DRESSING NASAL POPE 10X1.5X2.5 (GAUZE/BANDAGES/DRESSINGS) IMPLANT
DRSG NASAL POPE 10X1.5X2.5 (GAUZE/BANDAGES/DRESSINGS)
DRSG NASOPORE 8CM (GAUZE/BANDAGES/DRESSINGS) ×3 IMPLANT
DURASEAL SPINE SEALANT 3ML (MISCELLANEOUS) ×3 IMPLANT
ELECT COATED BLADE 2.86 ST (ELECTRODE) ×3 IMPLANT
ELECT REM PT RETURN 9FT ADLT (ELECTROSURGICAL)
ELECTRODE REM PT RTRN 9FT ADLT (ELECTROSURGICAL) IMPLANT
FILTER ARTHROSCOPY CONVERTOR (FILTER) ×6 IMPLANT
FLOSEAL 10ML (HEMOSTASIS) IMPLANT
FORCEPS BIPOLAR SPETZLER 8 1.0 (NEUROSURGERY SUPPLIES) ×3 IMPLANT
GLOVE SURG SS PI 7.5 STRL IVOR (GLOVE) ×3 IMPLANT
GOWN STRL REUS W/ TWL LRG LVL3 (GOWN DISPOSABLE) ×4 IMPLANT
GOWN STRL REUS W/TWL LRG LVL3 (GOWN DISPOSABLE) ×6
HEMOSTAT SURGICEL 2X14 (HEMOSTASIS) ×3 IMPLANT
KIT BASIN OR (CUSTOM PROCEDURE TRAY) ×3 IMPLANT
KIT ROOM TURNOVER OR (KITS) ×3 IMPLANT
LIQUID BAND (GAUZE/BANDAGES/DRESSINGS) ×3 IMPLANT
NEEDLE SPNL 20GX3.5 QUINCKE YW (NEEDLE) ×3 IMPLANT
NS IRRIG 1000ML POUR BTL (IV SOLUTION) ×3 IMPLANT
PAD ARMBOARD 7.5X6 YLW CONV (MISCELLANEOUS) ×6 IMPLANT
PAD ENT ADHESIVE 25PK (MISCELLANEOUS) ×3 IMPLANT
PATTIES SURGICAL .5 X3 (DISPOSABLE) ×3 IMPLANT
PENCIL FOOT CONTROL (ELECTRODE) ×3 IMPLANT
SHEATH ENDOSCRUB 0 DEG (SHEATH) ×3 IMPLANT
SHEATH ENDOSCRUB 30 DEG (SHEATH) IMPLANT
SHEATH ENDOSCRUB 45 DEG (SHEATH) ×6 IMPLANT
SOLUTION ANTI FOG 6CC (MISCELLANEOUS) ×3 IMPLANT
SPECIMEN JAR SMALL (MISCELLANEOUS) ×6 IMPLANT
SPONGE GAUZE 4X4 12PLY STER LF (GAUZE/BANDAGES/DRESSINGS) ×3 IMPLANT
STRIP CLOSURE SKIN 1/2X4 (GAUZE/BANDAGES/DRESSINGS) ×3 IMPLANT
SUT CHROMIC 3 0 SH 27 (SUTURE) ×3 IMPLANT
SUT ETHILON 3 0 PS 1 (SUTURE) IMPLANT
SUT SILK 2 0 SH (SUTURE) ×6 IMPLANT
SUT VIC AB 3-0 SH 27 (SUTURE) ×2
SUT VIC AB 3-0 SH 27XBRD (SUTURE) ×2 IMPLANT
SWAB COLLECTION DEVICE MRSA (MISCELLANEOUS) IMPLANT
SYR 50ML SLIP (SYRINGE) IMPLANT
SYRINGE 10CC LL (SYRINGE) ×6 IMPLANT
TAPE CLOTH SURG 4X10 WHT LF (GAUZE/BANDAGES/DRESSINGS) ×3 IMPLANT
TOWEL OR 17X24 6PK STRL BLUE (TOWEL DISPOSABLE) ×3 IMPLANT
TRACKER ENT INSTRUMENT (MISCELLANEOUS) ×3 IMPLANT
TRACKER ENT PATIENT (MISCELLANEOUS) ×3 IMPLANT
TRAY ENT MC OR (CUSTOM PROCEDURE TRAY) ×3 IMPLANT
TUBE CONNECTING 12X1/4 (SUCTIONS) ×3 IMPLANT
TUBING STRAIGHTSHOT EPS 5PK (TUBING) ×3 IMPLANT
WIPE INSTRUMENT VISIWIPE 73X73 (MISCELLANEOUS) ×3 IMPLANT

## 2014-11-21 NOTE — Discharge Instructions (Signed)
No nose blowing or lifting > 25lbs for 2 weeks. [facility] ~ 2 weeks.

## 2014-11-21 NOTE — Anesthesia Postprocedure Evaluation (Signed)
  Anesthesia Post-op Note  Patient: Paula Hoffman  Procedure(s) Performed: Procedure(s): ENDOSCOPIC SINUS SURGERY WITH FUSION NAVIGATION (Bilateral) ABDOMINAL FAT GRAFT ([name])  Patient Location: PACU  Anesthesia Type:General  Level of Consciousness: awake, alert  and oriented  Airway and Oxygen Therapy: Patient Spontanous Breathing and Patient connected to nasal cannula oxygen  Post-op Pain: mild  Post-op Assessment: Post-op Vital signs reviewed, Patient's Cardiovascular Status Stable, Respiratory Function Stable, Patent Airway and No signs of Nausea or vomiting  Post-op Vital Signs: Reviewed and stable  Last Vitals:  Filed Vitals:   11/21/14 1020  BP:   Pulse: 62  Temp: 36.6 C  Resp: 19    Complications: No apparent anesthesia complications

## 2014-11-21 NOTE — Transfer of Care (Signed)
Immediate Anesthesia Transfer of Care Note  Patient: Paula Hoffman  Procedure(s) Performed: Procedure(s): ENDOSCOPIC SINUS SURGERY WITH FUSION NAVIGATION (Bilateral) ABDOMINAL FAT GRAFT ([name])  Patient Location: PACU  Anesthesia Type:General  Level of Consciousness: awake, alert  and oriented  Airway & Oxygen Therapy: Patient Spontanous Breathing and Patient connected to face mask oxygen  Post-op Assessment: Report given to PACU RN, Post -op Vital signs reviewed and stable and Patient moving all extremities X 4  Post vital signs: Reviewed and stable  Complications: No apparent anesthesia complications

## 2014-11-21 NOTE — Anesthesia Procedure Notes (Signed)
Procedure Name: Intubation Date/Time: 11/21/2014 7:40 AM Performed by: Reine JustFLOWERS, Chanley Mcenery T Pre-anesthesia Checklist: Patient identified, Emergency Drugs available, Suction available, Patient being monitored and Timeout performed Patient Re-evaluated:Patient Re-evaluated prior to inductionOxygen Delivery Method: Circle system utilized and Simple face mask Preoxygenation: Pre-oxygenation with 100% oxygen Intubation Type: IV induction Ventilation: Mask ventilation without difficulty Laryngoscope Size: Miller and 2 Grade View: Grade I Tube type: Oral Tube size: 7.5 mm Number of attempts: 1 Airway Equipment and Method: Patient positioned with wedge pillow and Stylet Placement Confirmation: ETT inserted through vocal cords under direct vision,  positive ETCO2 and breath sounds checked- equal and bilateral Secured at: 22 cm Tube secured with: Tape Dental Injury: Teeth and Oropharynx as per pre-operative assessment

## 2014-11-21 NOTE — Progress Notes (Addendum)
Subjective: POD#0 from endoscopic repair of right lateral sphenoid/Sternberg's canal encephalocele with alloderm inlay/abdominal fat onlay, and right nasoseptal flap. Absorbable nasal packing in place, hemostatic nasal cavity, awake and responds to questions.  Objective: Vital signs in last 24 hours: Temp:  [97.8 F (36.6 C)-98.7 F (37.1 C)] 97.9 F (36.6 C) (12/15 1100) Pulse Rate:  [60-91] 60 (12/15 1100) Resp:  [17-23] 23 (12/15 1100) BP: (114-159)/(35-91) 159/90 mmHg (12/15 1100) SpO2:  [98 %-100 %] 100 % (12/15 1100)  CN 2-12 intact and symmetric, EOMI, PERRLA, right nasal cavity with absorbable nasopore in place, left nasal cavity normal. Oral cavity clear.  @LABLAST2 (wbc:2,hgb:2,hct:2,plt:2)  Recent Labs  11/19/14 0556 11/20/14 0748  NA 138 139  K 3.6* 3.7  CL 102 105  CO2 21 23  GLUCOSE 111* 89  BUN 8 9  CREATININE 0.63 0.72  CALCIUM 8.8 8.4    Medications: Current facility-administered medications: [MAR Hold] acetaminophen (TYLENOL) tablet 650 mg, 650 mg, Oral, Q6H PRN, [name], MD;  Mitzi Hansen[MAR Hold] cefTRIAXone (ROCEPHIN) 2 g in dextrose 5 % 50 mL IVPB, 2 g, Intravenous, Q12H, [name], MD, 2 g at 11/21/14 0544;  dextrose 5 %-0.9 % sodium chloride infusion, , Intravenous, Continuous, Melvenia BeamMitchell Modupe Shampine, MD, Last Rate: 75 mL/hr at 11/20/14 2250 docusate sodium (COLACE) capsule 100 mg, 100 mg, Oral, BID PRN, Melvenia BeamMitchell Nayellie Sanseverino, MD;  fentaNYL (SUBLIMAZE) 0.05 MG/ML injection, , , , ;  fentaNYL (SUBLIMAZE) injection 25-50 mcg, 25-50 mcg, Intravenous, Q5 min PRN, Sr Gypsy Balsamheodore B Manny, MD, 50 mcg at 11/21/14 1031;  ibuprofen (ADVIL,MOTRIN) tablet 400 mg, 400 mg, Oral, Q4H PRN, Melvenia BeamMitchell Damar Petit, MD meperidine (DEMEROL) injection 6.25-12.5 mg, 6.25-12.5 mg, Intravenous, Q5 min PRN, Sr Gypsy Balsamheodore B Manny, MD;  ondansetron [facility](ZOFRAN) injection 4 mg, 4 mg, Intravenous, Q6H PRN, Melvenia BeamMitchell Javien Tesch, MD;  phenol (CHLORASEPTIC) mouth spray 2 spray, 2 spray, Mouth/Throat, Q8H PRN, Melvenia BeamMitchell Parmvir Boomer, MD;   promethazine (PHENERGAN) injection 6.25-12.5 mg, 6.25-12.5 mg, Intravenous, Q15 min PRN, Sr Gypsy Balsamheodore B Manny, MD [MAR Hold] vancomycin (VANCOCIN) IVPB 1000 mg/200 mL premix, 1,000 mg, Intravenous, Q8H, Thuy Dien Dang, RPH, 1,000 mg at 11/21/14 [id number]  Assessment/Plan: Doing well s/p multilayer alloderm/abdominal fat/right nasoseptal flap repair of a right lateral sphenoid/Sternberg's canal encephalocele. Her nasal packing is absorbable so I will see her back in the office in ~ 2 weeks to recheck the nose. She should avoid nose blowing or lifting >25lbs/straining for 2 weeks. Her PCP/family medicine team may want to consider putting her on a diuretic such as acetazolamide as Sternberg's canal/sphenoid encephaloceles are often associated with benign/idiopathic intracranial hypertension and acetazolamide will likely decrease her chances of recurrent encephaloceles/CSF leak. Will order strep pneumo/N. Meningitis vaccines while she is here. Continue antibiotics for bacterial meningitis per primary team.   LOS: 3 days   Melvenia BeamGore, Ahrianna Siglin 11/21/2014, 11:13 AM

## 2014-11-21 NOTE — H&P (Signed)
11/21/2014  Melvenia BeamGore, Karmina Zufall  PREOPERATIVE HISTORY AND PHYSICAL  CHIEF COMPLAINT: CSF rhinorrhea, chronic sphenoid sinusitis  HISTORY: This is a 43 year old who presented with meningitis and right CSF rhinorrhea/right opacified sphenoid.  She now presents for bilateral sinus surgery and repair of sphenoid CSF leak, posible abdominal fat graft, possible nasoseptal flap.  Dr. Emeline DarlingGore, Clovis RileyMitchell has discussed the risks (recurrent CSF leak, infection, anesthesia risks, bleeding, olfaction changes, scarring, etc), benefits, and alternatives of this procedure. The patient understands the risks and would like to proceed with the procedure. The chances of success of the procedure are >50% and the patient understands this. I personally performed an examination of the patient within 24 hours of the procedure.  PAST MEDICAL HISTORY: History reviewed. No pertinent past medical history.  PAST SURGICAL HISTORY: History reviewed. No pertinent past surgical history.  MEDICATIONS: Scheduled Meds: . cefTRIAXone (ROCEPHIN)  IV  2 g Intravenous Q12H  . vancomycin  1,000 mg Intravenous Q8H   Continuous Infusions: . dextrose 5 % and 0.9% NaCl 75 mL/hr at 11/20/14 2250   PRN Meds:.acetaminophen   ALLERGIES: Allergies  Allergen Reactions  . Percocet [Oxycodone-Acetaminophen] Nausea Only and Other (See Comments)    Feels high  . Strawberry Nausea Only    Headache  . Tramadol Nausea Only     SOCIAL HISTORY: History   Social History  . Marital Status: Single    Spouse Name: [name]    Number of Children: [name]  . Years of Education: [name]   Occupational History  . Not on file.   Social History Main Topics  . Smoking status: Never Smoker   . Smokeless tobacco: Not on file  . Alcohol Use: No  . Drug Use: No  . Sexual Activity: Not on file   Other Topics Concern  . Not on file   Social History Narrative    FAMILY HISTORY: Family History  Problem Relation Age of Onset  . Hypertension Mother   .  Cancer Mother   . Prostate cancer Father     REVIEW OF SYSTEMS:  HEENT: CSF rhinorrhea, headaches, fevers, otherwise negative x 12 systems except per HPI.  PHYSICAL EXAM:  GENERAL:NAD   VITAL SIGNS:   Filed Vitals:   11/21/14 0211  BP: 122/67  Pulse: 73  Temp: 98.3 F (36.8 C)  Resp: 18  SKIN:  Warm,dry HEENT:  Positive right reservoir test with clear CSF rhinorrhea from right nostril NECK:  supple ABDOMEN:  soft MUSCULOSKELETAL: normal strength PSYCH:  Normal affect NEUROLOGIC:  CN 2-12 intact and symmetric  DIAGNOSTIC STUDIES: CT head shows opacified right sphenoid  ASSESSMENT AND PLAN: Plan to proceed with bilateral sinus surgery and repair of sphenoid CSF leak, possible abdominal fat graft, possible nasoseptal flap. Patient understands the risks, benefits, and alternatives. Informed written consent signed, witnessed, and on chart. 11/21/2014  5:25 AM Melvenia BeamGore, Guinevere Stephenson

## 2014-11-21 NOTE — Op Note (Signed)
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 No injury to the sphenopalatine, anterior ethmoid, or posterior ethmoid arteries, no injury to the ethmoid skull base, no injury to the orbits. 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  ESTIMATED BLOOD LOSS: Approximately 100 mL.  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 After adequate endotracheal anesthesia was obtained, the skin was draped in sterile fashion. Lidocaine 1% with 1:100,000 epinephrine was injected into the bilateral greater palatine foramina transorally. The nose was decongested with topical Afrin pledgets which were then removed. The abdomen was prepped and draped in sterile fashion with Duraprep, and the abdomen and face were draped out in  sterile fashion with sterile drapes. The medtronic fusion image guidance system was placed in the standard fashion and the [patioent was registered to the medtronic fusion CT with good accuracy and precision.  I harvested an abdominal fat graft in the standard fashion from a midline infraumbilical incision using the 15 blade, adson, and then the Bovie and Allis. The ~ 2x2 cm fat graft was placed in saline and then the incision was packed with surgicel and then closed with deep buried subcutaneous 3-0 vicryl and interrupted simple 3-0 chromic gut for the skin.  Attention then was directed toward the right sinuses. Lidocaine 1% with 1:100,000 epinephrine was injected in the region of the anterior portion of the right middle turbinate and uncinate process. The uncinate process was identified using the 0 degree endoscope and image guidance suction and the right uncinate process was  removed systematically superiorly to inferiorly with back-biting forceps. The right middle turbinate was removed and placed in saline. Next, the right maxillary sinus was identified and the medial wall of the right maxillary sinus was widely opened using the microdebrider. I then switched to the 45 degree scope and inspected the maxillary sinus. The right maxillary sinus was widely opened including the right maxillary natural ostium using the microdebrider. I removed the posterior right maxillary mucosa to allow the nasoseptal flap to wrap around the posterior aspect of the right maxillary sinus and suctioned out the sinus. I gently cauterized the stump of the right middle turbinate pedicle. I then switched back to the zero degree scope and straight microdebrider.  The right anterior and posterior ethmoid air cells were entered after identifying the right ethmoid bulla using the image guidance suction and dissected up to the skull base and out to the lamina papyrecea using the 55 degree curette and the 73mm Kerrison punch. All of the  ethmoid cells were meticulously dissected out using the Kerrison and debrider. Ethmoid ledges were meticulously removed using the microdebrider  and 3mm Kerrison. The skull base and lamina papyracea were preserved throughout. I used the bipolar to gently cauterize the superior middle turbinate stump. No CSF was seen from the ethmoid skull base or cribriform plate.  The right sphenoid natural ostium was then identified using the image guidance suction and the right sphenoid was widely opened using the 3mm Kerrison all the way to the skull base and lamina papyracea. At the lateral wall/lateral aspect of the right sphenoid, corresponding to a small area of bony dehiscence on the image guidance CT scan, I identified a pulsatile area of dehiscent brain tissue with CSF leak consistent with a right lateral sphenoid/Sternberg's canal encephalocele. I removed all of the right sphenoid mucosa with the suction and 45 blakesley to prepare the right sphenoid for the encephalocele/CSF leak repair.  Next I switched back to the 45 degree endoscope, 60 degree microdebrider, and curved image guidance suction. The skull base was identified and dissected anteriorly using the 90 degree Kuhn-Bolger currette. The Agger Nasi cell was taken down and then the right frontal sinus was widely opened in a Draf IIA fashion using the curved debrider, Stammberger 4mm frontal mushroom punch, and the 90 degree currette.  Next I harvested a right nasoseptal flap using the 15 blade and Metzenbaum scissors in the standard fashion. I briefly placed the right nasoseptal flap into the posterior nasal cavity. I then placed an ~ 1x1 cm alloderm inlay graft on the dural side of the right lateral sphenoid encephalocele. I then, after ensuring all visible sphenoid mucosa was removed, obliterated the right sphenoid completely by packing it with the abdominal fat graft as an onlay graft. Lastly, I gently rotated the right nasoseptal flap, with the periosteum  side facing the sphenoid/abdominal fat graft and the mucosal side facing out, over the abdominal fat graft repair and the cribriform. No further CSF leakage was seen.  I then bolstered the alloderm/fat/nasoseptal flap repair with surgicel, Duraseal, surgicel, and then nasopore.  The patient's stomach was suctioned out using a flexible OGtube. The patient was awakened from anesthesia and extubated without difficulty. The patient tolerated the procedure well and returned to the recovery room in stable condition.   Dr. Melvenia BeamMitchell Deddrick Saindon was present and performed the entire procedure. 10:16 AM Melvenia BeamGore, Banessa Mao 11/21/2014

## 2014-11-21 NOTE — Progress Notes (Signed)
Family Medicine Teaching Service Daily Progress Note Intern Pager: [phone]  Patient name: Paula Hoffman Medical record number: [id number] Date of birth: 1971/10/29 Age: 43 y.o. Gender: female  Primary Care Provider: No PCP Per Patient Consultants: none Code Status: full  Pt Overview and Major Events to Date:  12/12 admitted for treatment of bacterial meningitis  Assessment and Plan: Paula Hoffman is a 43 y.o. female with history of anemia presenting with headache found to have likely bacterial meningitis.   Bacterial meningitis, 2/2 encephalocele, sphenoid: elevated protein, low glucose, and elevated WBC indicate likely bacterial cause of meningitis, which is likely cause of headache and neck pain. No risk factors for meningitis. No risk factors for listeria. Unlikely to be viral given CSF findings. Neurologically intact on exam. - f/u BCx: NGTD - CSF Cx: NGTD - CSF gram stain with no organisms - f/u HSV pcr: Negative - CTX 2g BID, Vancomycin 1g TID per pharm -- s/p surgery, will consider 12/15 day 1 of abx - droplet precautions until IV antibiotics off - added on CSF fungal culture.  Anemia, reports iron deficiency: not on therapy for this. -will need iron studies as an outpatient  FEN/GI: regular diet Prophylaxis: lovenox  Disposition: discharge pending BCx and CSF Cx results  Subjective:  Patient currently recovering from surgery. Reports some postanesthesia nausea and fatigue. No complaints of pain or HA at this time.  Objective: Temp:  [98.1 F (36.7 C)-98.7 F (37.1 C)] 98.6 F (37 C) (12/15 0601) Pulse Rate:  [72-91] 80 (12/15 0601) Resp:  [16-20] 20 (12/15 0601) BP: (114-138)/(35-84) 138/35 mmHg (12/15 0601) SpO2:  [98 %-100 %] 99 % (12/15 0601) Physical Exam: General: laying in bed, NAD, answers questions appropriately   HEENT: post surgical packing present. MMM Cardiovascular: rrr, no murmur Respiratory: CTAB, no wheezes Extremities: no edema,  peripheral pulses intact.    Laboratory:  Recent Labs Lab 11/18/14 1330 11/19/14 0556 11/20/14 0748  WBC 10.5 10.7* 7.1  HGB 11.1* 10.5* 10.5*  HCT 33.6* 31.5* 31.2*  PLT 390 364 346    Recent Labs Lab 11/18/14 1330 11/19/14 0556 11/20/14 0748  NA 134* 138 139  K 3.4* 3.6* 3.7  CL 97 102 105  CO2 24 21 23   BUN 6 8 9   CREATININE 0.72 0.63 0.72  CALCIUM 8.9 8.8 8.4  GLUCOSE 99 111* 89    CSF gram stain no organisms CSF cell count WBC 2900, Neutrophils 86%, RBC 7 CSF glucose 22 CSF protein 126 CSF culture gram stain no organisms seen  No results found.  Kathee DeltonIan D McKeag, MD 11/21/2014, 9:37 AM PGY-1, [facility] FPTS Intern pager: [phone], text pages welcome

## 2014-11-22 ENCOUNTER — Encounter (HOSPITAL_COMMUNITY): Payer: Self-pay | Admitting: Otolaryngology

## 2014-11-22 LAB — CBC
HEMATOCRIT: 29.3 % — AB (ref 36.0–46.0)
Hemoglobin: 9.7 g/dL — ABNORMAL LOW (ref 12.0–15.0)
MCH: 28.4 pg (ref 26.0–34.0)
MCHC: 33.1 g/dL (ref 30.0–36.0)
MCV: 85.7 fL (ref 78.0–100.0)
Platelets: 379 10*3/uL (ref 150–400)
RBC: 3.42 MIL/uL — ABNORMAL LOW (ref 3.87–5.11)
RDW: 13 % (ref 11.5–15.5)
WBC: 10.3 10*3/uL (ref 4.0–10.5)

## 2014-11-22 LAB — BASIC METABOLIC PANEL
Anion gap: 14 (ref 5–15)
BUN: 5 mg/dL — AB (ref 6–23)
CHLORIDE: 97 meq/L (ref 96–112)
CO2: 23 meq/L (ref 19–32)
CREATININE: 0.65 mg/dL (ref 0.50–1.10)
Calcium: 8.6 mg/dL (ref 8.4–10.5)
GFR calc Af Amer: >90 mL/min (ref >90)
GFR calc non Af Amer: >90 mL/min (ref >90)
GLUCOSE: 98 mg/dL (ref 70–99)
POTASSIUM: 3.2 meq/L — AB (ref 3.7–5.3)
Sodium: 134 mEq/L — ABNORMAL LOW (ref 137–147)

## 2014-11-22 LAB — CSF CULTURE: Culture: NO GROWTH

## 2014-11-22 LAB — CSF CULTURE W GRAM STAIN

## 2014-11-22 MED ORDER — SODIUM CHLORIDE 0.9 % IJ SOLN: 10.0000 mL | INTRAMUSCULAR | Status: DC | PRN

## 2014-11-22 MED ORDER — ACETAZOLAMIDE 250 MG PO TABS
250.0000 mg | ORAL_TABLET | Freq: Two times a day (BID) | ORAL | Status: DC
  Administered 2014-11-22 – 2014-11-23 (×3): 250 mg via ORAL
  Filled 2014-11-22 (×4): qty 1

## 2014-11-22 NOTE — Progress Notes (Signed)
Family Medicine Teaching Service Daily Progress Note Intern Pager: [phone]  Patient name: Paula Hoffman Medical record number: [id number] Date of birth: 06/06/1971 Age: [age] y.o. Gender: female  Primary Care Provider: No PCP Per Patient Consultants: none Code Status: full  Pt Overview and Major Events to Date:  12/12 admitted for treatment of bacterial meningitis  Assessment and Plan: Paula Hoffman is a 43 y.o. female with history of anemia presenting with headache found to have likely bacterial meningitis.   Bacterial meningitis: elevated protein, low glucose, and elevated WBC indicate likely bacterial cause of meningitis, which is likely cause of headache and neck pain. No risk factors for meningitis. No risk factors for listeria. Unlikely to be viral given CSF findings. Neurologically intact on exam. - f/u BCx: NGTD - CSF Cx: NGTD - CSF gram stain with no organisms -f/u HSV pcr: Neg - Continue CTX 2g BID, DC Vancomycin - PICC order placed for ability to DC on IV abx -droplet precautions until IV antibiotics off  Anemia, reports iron deficiency: not on therapy for this. -will need iron studies as an outpatient  FEN/GI: regular diet Prophylaxis: lovenox  Disposition: discharge tomorrow w/ home health.  Subjective:  Feeling better. Mild HA this AM. Says its the first since treatment started. Denies any other symptoms at this time. Inquiring about DC.  Objective: Temp:  [97.5 F (36.4 C)-98.6 F (37 C)] 98.6 F (37 C) (12/16 1132) Pulse Rate:  [67-93] 73 (12/16 1132) Resp:  [18-20] 20 (12/16 1132) BP: (123-147)/(70-91) 123/76 mmHg (12/16 1132) SpO2:  [100 %] 100 % (12/16 1132) Physical Exam: General: sitting up in bed, NAD; pleasant  HEENT: PERRLA, EOMI, neck with full ROM Cardiovascular: rrr, no murmur Respiratory: CTAB, no wheezes Extremities: no edema, peripheral pulses intact.  Neuro: CN 2-12 intact, 5/5 strength throughout bilaterally. Sensation intact.  Gait normal.   Laboratory:  Recent Labs Lab 11/19/14 0556 11/20/14 0748 11/22/14 1107  WBC 10.7* 7.1 10.3  HGB 10.5* 10.5* 9.7*  HCT 31.5* 31.2* 29.3*  PLT 364 346 379    Recent Labs Lab 11/19/14 0556 11/20/14 0748 11/22/14 1107  NA 138 139 134*  K 3.6* 3.7 3.2*  CL 102 105 97  CO2 21 23 23   BUN 8 9 5*  CREATININE 0.63 0.72 0.65  CALCIUM 8.8 8.4 8.6  GLUCOSE 111* 89 98    CSF gram stain no organisms CSF cell count WBC 2900, Neutrophils 86%, RBC 7 CSF glucose 22 CSF protein 126 CSF culture gram stain no organisms seen  No results found.  Kathee DeltonIan D Keirstyn Aydt, MD 11/22/2014, 12:22 PM PGY-1, [facility] FPTS Intern pager: [phone], text pages welcome

## 2014-11-22 NOTE — Progress Notes (Signed)
Peripherally Inserted Central Catheter/Midline Placement  The IV Nurse has discussed with the patient and/or persons authorized to consent for the patient, the purpose of this procedure and the potential benefits and risks involved with this procedure.  The benefits include less needle sticks, lab draws from the catheter and patient may be discharged home with the catheter.  Risks include, but not limited to, infection, bleeding, blood clot (thrombus formation), and puncture of an artery; nerve damage and irregular heat beat.  Alternatives to this procedure were also discussed.  PICC/Midline Placement Documentation        Paula Hoffman, Paula Hoffman Paula Hoffman 11/22/2014, 3:11 PM

## 2014-11-22 NOTE — Progress Notes (Signed)
Talked to patient about DCP; home IV abx; patient became very emotional and began crying- lots of emotional support given about going home on IV antibiotics; Patient chose [facility] for home IV antibiotics; Paula ModenaPam Bari Handshoe RN with [facility] will talk to patient this afternoon. Patient is concerned about returning to her job, CM encouraged pt to take some time off from work to allow her body to heal; Patient has not seen a PCP in 2 years and has agreed to go to [facility] follow up medical care - apt made for Dec 18,2015 at 8:30 am; Attending MD please [facility] with name of drug, dosage and durationAlexis Hoffman: B Ramiro Pangilinan RN,BSN,MHA [phone]

## 2014-11-22 NOTE — Progress Notes (Signed)
[facility]  Patient Status:   [facility]  [facility]  [facility] and [facility] for [facility]  [facility] will provide in hospital teaching for home IV ABX for self administration at home. [facility] will follow and support transition home when deemed appropriate.  If patient discharges after hours, please call [phone].   Paula Hoffman 11/22/2014, 5:05 PM

## 2014-11-23 MED ORDER — ACETAZOLAMIDE 250 MG PO TABS: 250.0000 mg | ORAL_TABLET | Freq: Two times a day (BID) | ORAL | Status: DC

## 2014-11-23 MED ORDER — POTASSIUM CHLORIDE CRYS ER 20 MEQ PO TBCR
20.0000 meq | EXTENDED_RELEASE_TABLET | Freq: Two times a day (BID) | ORAL | Status: DC
  Administered 2014-11-23: 20 meq via ORAL
  Filled 2014-11-23: qty 1

## 2014-11-23 MED ORDER — DEXTROSE 5 % IV SOLN: 2.0000 g | Freq: Two times a day (BID) | INTRAVENOUS | Status: DC

## 2014-11-23 MED ORDER — HEPARIN SOD (PORK) LOCK FLUSH 100 UNIT/ML IV SOLN
250.0000 [IU] | INTRAVENOUS | Status: AC | PRN
  Administered 2014-11-23: 250 [IU]

## 2014-11-23 NOTE — Progress Notes (Signed)
Family Medicine Teaching Service Daily Progress Note Intern Pager: [phone]  Patient name: Paula Hoffman Medical record number: [id number] Date of birth: 07/24/1971 Age: 43 y.o. Gender: female  Primary Care Provider: No PCP Per Patient Consultants: none Code Status: full  Pt Overview and Major Events to Date:  12/12 admitted for treatment of bacterial meningitis  Assessment and Plan: Paula PainLailtrice B Roadcap is a 43 y.o. female with history of anemia presenting with headache found to have likely bacterial meningitis.   Bacterial meningitis: elevated protein, low glucose, and elevated WBC indicate likely bacterial cause of meningitis, which is likely cause of headache and neck pain. No risk factors for meningitis. No risk factors for listeria. Unlikely to be viral given CSF findings. Neurologically intact on exam. - f/u BCx: NGTD - CSF Cx: NGTD - CSF gram stain with no organisms -f/u HSV pcr: Neg - Continue CTX 2g BID, DC Vancomycin - PICC placed for ability to DC on IV abx -droplet precautions until IV antibiotics off  Anemia, reports iron deficiency: not on therapy for this. -will need iron studies as an outpatient  FEN/GI: regular diet Prophylaxis: lovenox  Disposition: discharge today w/ home health to provide IV abx  Subjective:  Feeling better. No HA today. Ready for DC home. Was tearful when first entered room, patient states she was just praying--but was very thankful for the care she received.   Objective: Temp:  [98 F (36.7 C)-98.6 F (37 C)] 98.1 F (36.7 C) (12/17 0601) Pulse Rate:  [73-94] 94 (12/17 0601) Resp:  [18-20] 18 (12/17 0601) BP: (99-123)/(58-76) 99/58 mmHg (12/17 0601) SpO2:  [100 %] 100 % (12/17 0601) Physical Exam: General: sitting up in bed, NAD; pleasant HEENT: PERRLA, EOMI, neck with full ROM Cardiovascular: rrr, no murmur Respiratory: CTAB, no wheezes Extremities: no edema, peripheral pulses intact.  Neuro: CN 2-12 intact, 5/5 strength  throughout bilaterally. Sensation intact. Gait normal.   Laboratory:  Recent Labs Lab 11/19/14 0556 11/20/14 0748 11/22/14 1107  WBC 10.7* 7.1 10.3  HGB 10.5* 10.5* 9.7*  HCT 31.5* 31.2* 29.3*  PLT 364 346 379    Recent Labs Lab 11/19/14 0556 11/20/14 0748 11/22/14 1107  NA 138 139 134*  K 3.6* 3.7 3.2*  CL 102 105 97  CO2 21 23 23   BUN 8 9 5*  CREATININE 0.63 0.72 0.65  CALCIUM 8.8 8.4 8.6  GLUCOSE 111* 89 98    CSF gram stain no organisms CSF cell count WBC 2900, Neutrophils 86%, RBC 7 CSF glucose 22 CSF protein 126 CSF culture gram stain no organisms seen  No results found.  Kathee DeltonIan D McKeag, MD 11/23/2014, 8:32 AM PGY-1, [place] [facility] FPTS Intern pager: [phone], text pages welcome

## 2014-11-23 NOTE — Progress Notes (Signed)
CARE MANAGEMENT NOTE 11/23/2014  Patient:  Paula Hoffman,Paula Hoffman   Account Number:  [id number]  Date Initiated:  11/20/2014  Documentation initiated by:  Jiles CrockerHANDLER,BRENDA  Subjective/Objective Assessment:   ADMITTED WITH MENINGITIS     Action/Plan:   CM FOLLOWING FOR DCP   Anticipated DC Date:  11/24/2014   Anticipated DC Plan:  HOME/SELF CARE      DC Planning Services  CM consult      Choice offered to / List presented to:  C-1 Patient        HH arranged  HH-1 RN  IV Antibiotics      HH agency  [facility]   Status of service:  Completed, signed off Medicare Important Message given?   (If response is "NO", the following Medicare IM given date fields will be blank) Date Medicare IM given:   Medicare IM given by:   Date Additional Medicare IM given:   Additional Medicare IM given by:    Discharge Disposition:  HOME W HOME HEALTH SERVICES  Per UR Regulation:  Reviewed for med. necessity/level of care/duration of stay  If discussed at Long Length of Stay Meetings, dates discussed:    Comments:  11/23/2014 1245 [facility], and IV abx. Isidoro DonningAlesia Nekita Pita RN CCM Case Mgmt phone [phone]  11/22/2014 12:33 PM Talked to patient about DCP; home IV abx; patient became very emotional and began crying- lots of emotional support given about going home on IV antibiotics; Patient chose [facility] for home IV antibiotics; Jeri ModenaPam Chandler RN with [facility] will talk to patient this afternoon. Patient is concerned about returning to her job, CM encouraged pt to take some time off from work to allow her body to heal; Patient has not seen a PCP in 2 years and has agreed to [facility] follow up medical care - apt made for Dec 18,2015 at 8:30 am; Attending MD please [facility] for home IV antibiotics with name of drug, dosage and durationAlexis Goodell: Hoffman Chandler RN,BSN,MHA [phone]     11/21/2014- TO SURGERY TO  REPAIR CSF LEAK - REPAIR OF RIGHT SPHENOID; BACTERIAL MENINGITIS- IV ABX; DCP - HOME WHEN STABLAbelino Derrick; Hoffman CHANDLER RN,BSN,MHA [phone]  12/14/2015Abelino Derrick- Hoffman CHANDLER RN,BSN,MHA [phone]

## 2014-11-23 NOTE — Progress Notes (Signed)
D/C orders received, pt for D/C home today with [facility].  IV and telemetry D/C.  Rx and D/C instructions given with verbalized understanding.  Family at bedside to assist with D/C.  Staff brought pt downstairs via wheelchair.  

## 2014-11-23 NOTE — Progress Notes (Signed)
Utilization review completed. Shiza Thelen, RN, BSN. 

## 2014-11-24 NOTE — Discharge Summary (Signed)
Family [facility]  Patient name: Paula Hoffman Medical record number: [id number] Date of birth: 03-16-1971 Age: 43 y.o. Gender: female Date of Admission: 11/18/2014  Date of Discharge: 11/23/14 Admitting Physician: [name], MD  Primary Care Provider: No PCP Per Patient Consultants: ENT  Indication for Hospitalization: New onset headaches  Discharge Diagnoses/Problem List:  Bacterial meningitis Encephalocele  Disposition: Home with home health  Discharge Condition: Stable  Brief Hospital Course:  Patient is a 43 year old female with known anemia who who presented with a worsening headache over the past week. She been seen multiple times in the ED for these symptoms. She been treated previously with analgesics. She had 2 negative CT scans over a one-week period prior to admission. She had noted intermittently fevers and night sweats. Reported 2 separate episodes of mild to moderate confusion and racing thoughts approximately 2 days prior to admission. She returned to the ED secondary to a worsening headache and malaise. LP was performed in the ED and was consistent with bacterial meningitis. She was admitted to the hospital with minimally elevated leukocytes and was afebrile at the time.  Patient was started immediately on vancomycin and ceftriaxone. No organisms were seen on Gram stain. CSF analysis was consistent with bacterial meningitis however no growth was ever seen on CSF cultures.  After continued history taking during her admission it was noted that she had been experiencing a consistent status of rhinorrhea over the past few weeks. Secondary to the unique presentation of her symptoms and condition the inpatient team deemed it suitable to consult ENT to workup this rhinorrhea. ENT promptly saw the patient and was able to assess that patient had an encephalocele which directly led to patient's meningeal infection. Patient was  swiftly taken to the ED the following day to have this repaired.  Patient tolerated surgery very well. She recovered and reported no continued symptoms after that time. Patient denied any continued headaches confusion fevers chills or any other symptoms. Patient was deemed stable for discharge with the notion that she would require IV anti-biotics at home. Because of this a PICC line was placed.  Patient was then deemed medically stable for discharge. Follow-up with ENT was obtained. Patient was discharged   Issues for Follow Up:  Patient has been sent home on IV antibiotics via a PICC line. Will be followed by home health.  Significant Procedures: Encephalocele repair via ENT surgery  Significant Labs and Imaging:   Recent Labs Lab 11/19/14 0556 11/20/14 0748 11/22/14 1107  WBC 10.7* 7.1 10.3  HGB 10.5* 10.5* 9.7*  HCT 31.5* 31.2* 29.3*  PLT 364 346 379    Recent Labs Lab 11/18/14 1330 11/19/14 0556 11/20/14 0748 11/22/14 1107  NA 134* 138 139 134*  K 3.4* 3.6* 3.7 3.2*  CL 97 102 105 97  CO2 24 21 23 23   GLUCOSE 99 111* 89 98  BUN 6 8 9  5*  CREATININE 0.72 0.63 0.72 0.65  CALCIUM 8.9 8.8 8.4 8.6      Results/Tests Pending at Time of Discharge: CSF cultures, blood cultures (negative to date)  Discharge Medications:    Medication List    STOP taking these medications        amoxicillin 875 MG tablet  Commonly known as:  AMOXIL     doxycycline 100 MG capsule  Commonly known as:  VIBRAMYCIN     traMADol 50 MG tablet  Commonly known as:  ULTRAM      TAKE these  medications        acetaZOLAMIDE 250 MG tablet  Commonly known as:  DIAMOX  Take 1 tablet (250 mg total) by mouth 2 (two) times daily.     aspirin-sod bicarb-citric acid 325 MG Tbef tablet  Commonly known as:  ALKA-SELTZER  Take 325 mg by mouth every 6 (six) hours as needed.     cefTRIAXone 2 g in dextrose 5 % 50 mL  Inject 2 g into the vein every 12 (twelve) hours.     promethazine 25 MG  tablet  Commonly known as:  PHENERGAN  Take 1 tablet (25 mg total) by mouth every 6 (six) hours as needed for nausea.        Discharge Instructions: Please refer to Patient Instructions section of EMR for full details.  Patient was counseled important signs and symptoms that should prompt return to medical care, changes in medications, dietary instructions, activity restrictions, and follow up appointments.   Follow-Up Appointments: Follow-up Information    Follow up with Melvenia BeamGore, Mitchell, MD On 12/11/2014.   Specialty:  Otolaryngology   Why:  9:40 am   Contact information:   [address] [place] [address] 802 [phone]       [facility]     On 11/27/2014.   Why:  at 2 pm - follow up hospital visit   Contact information:   [address] [place] [id number] [phone]      Follow up with [facility].   Why:  for home IV antibiotics and Nursing   Contact information:   [address] [place] [address] [phone]       Kathee DeltonIan D Dmari Schubring, MD 11/24/2014, 11:54 PM PGY-1, [facility]

## 2014-11-25 LAB — CULTURE, BLOOD (ROUTINE X 2)
Culture: NO GROWTH
Culture: NO GROWTH

## 2014-11-28 ENCOUNTER — Encounter: Payer: Self-pay | Admitting: Internal Medicine

## 2014-11-28 ENCOUNTER — Ambulatory Visit: Payer: 59 | Attending: Internal Medicine | Admitting: Internal Medicine

## 2014-11-28 VITALS — BP 129/86 | HR 92 | Temp 97.8°F | Resp 17 | Wt 188.2 lb

## 2014-11-28 DIAGNOSIS — G009 Bacterial meningitis, unspecified: Secondary | ICD-10-CM | POA: Diagnosis present

## 2014-11-28 DIAGNOSIS — G960 Cerebrospinal fluid leak: Secondary | ICD-10-CM | POA: Diagnosis not present

## 2014-11-28 DIAGNOSIS — Z792 Long term (current) use of antibiotics: Secondary | ICD-10-CM | POA: Diagnosis not present

## 2014-11-28 DIAGNOSIS — D649 Anemia, unspecified: Secondary | ICD-10-CM | POA: Diagnosis not present

## 2014-11-28 DIAGNOSIS — Z139 Encounter for screening, unspecified: Secondary | ICD-10-CM

## 2014-11-28 DIAGNOSIS — G9601 Cranial cerebrospinal fluid leak, spontaneous: Secondary | ICD-10-CM

## 2014-11-28 DIAGNOSIS — N920 Excessive and frequent menstruation with regular cycle: Secondary | ICD-10-CM | POA: Insufficient documentation

## 2014-11-28 DIAGNOSIS — Z833 Family history of diabetes mellitus: Secondary | ICD-10-CM | POA: Diagnosis not present

## 2014-11-28 LAB — ANEMIA PANEL
%SAT: 9 % — ABNORMAL LOW (ref 20–55)
ABS Retic: 56.3 10*3/uL (ref 19.0–186.0)
FERRITIN: 25 ng/mL (ref 10–291)
Folate: 11.3 ng/mL
Iron: 29 ug/dL — ABNORMAL LOW (ref 42–145)
RBC.: 3.31 MIL/uL — AB (ref 3.87–5.11)
RETIC CT PCT: 1.7 % (ref 0.4–2.3)
TIBC: 318 ug/dL (ref 250–470)
UIBC: 289 ug/dL (ref 125–400)

## 2014-11-28 LAB — COMPLETE METABOLIC PANEL WITH GFR
ALT: 18 U/L (ref 0–35)
AST: 17 U/L (ref 0–37)
Albumin: 3.7 g/dL (ref 3.5–5.2)
Alkaline Phosphatase: 55 U/L (ref 39–117)
BUN: 10 mg/dL (ref 6–23)
CALCIUM: 9.1 mg/dL (ref 8.4–10.5)
CO2: 21 meq/L (ref 19–32)
Chloride: 109 mEq/L (ref 96–112)
Creat: 0.85 mg/dL (ref 0.50–1.10)
GFR, Est African American: >89 mL/min
GFR, Est Non African American: 84 mL/min
Glucose, Bld: 89 mg/dL (ref 70–99)
POTASSIUM: 4.2 meq/L (ref 3.5–5.3)
SODIUM: 138 meq/L (ref 135–145)
Total Bilirubin: 0.1 mg/dL — ABNORMAL LOW (ref 0.2–1.2)
Total Protein: 7.1 g/dL (ref 6.0–8.3)

## 2014-11-28 LAB — TSH: TSH: 0.259 u[IU]/mL — ABNORMAL LOW (ref 0.350–4.500)

## 2014-11-28 NOTE — Progress Notes (Signed)
Patient Demographics  Paula Hoffman, is a 43 y.o. female  ZHY:[id number]:[id number]  EXB:[id number]  DOB - August 09, 1971  CC:  Chief Complaint  Patient presents with  . Hospitalization Follow-up       HPI: Paula Hoffman is a 43 y.o. 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 Patient has No headache, No chest pain, No abdominal pain - No Nausea, No new weakness tingling or numbness, No Cough - SOB.  Allergies  Allergen Reactions  . Percocet [Oxycodone-Acetaminophen] Nausea Only and Other (See Comments)    Feels high  . Strawberry Nausea Only    Headache  . Tramadol Nausea Only   Past Medical History  Diagnosis Date  . Bacterial meningitis    Current Outpatient Prescriptions on File Prior to Visit  Medication Sig Dispense Refill  . acetaZOLAMIDE (DIAMOX) 250 MG tablet Take 1 tablet (250 mg total) by mouth 2 (two) times daily. 60 tablet 2  . cefTRIAXone 2 g in dextrose 5 % 50 mL Inject 2 g into the vein every 12 (twelve) hours.    Marland Kitchen. aspirin-sod bicarb-citric acid (ALKA-SELTZER) 325 MG TBEF tablet Take 325 mg by mouth every 6 (six) hours as needed.    .  promethazine (PHENERGAN) 25 MG tablet Take 1 tablet (25 mg total) by mouth every 6 (six) hours as needed for nausea. 20 tablet 0   No current facility-administered medications on file prior to visit.   Family History  Problem Relation Age of Onset  . Hypertension Mother   . Cancer Mother     liver cancer   . Prostate cancer Father   . Cancer Maternal Grandmother     breast cancer   . Diabetes Maternal Grandfather    History   Social History  . Marital Status: Single    Spouse Name: [name]    Number of Children: [name]  . Years of Education: [name]   Occupational History  . Not on file.   Social History Main Topics  . Smoking status: Never Smoker   . Smokeless tobacco: Not on file  . Alcohol Use: 0.0 oz/week    0 Not specified per week     Comment: occasional glass of wine  . Drug Use: No  . Sexual Activity: Not on file   Other Topics Concern  . Not on file   Social History Narrative    Review of Systems: Constitutional: Negative for fever, chills, diaphoresis, activity change, appetite change and fatigue. HENT: Negative for ear pain, nosebleeds, congestion, facial swelling, rhinorrhea, neck pain, neck stiffness and ear discharge.  Eyes: Negative for pain, discharge, redness, itching and visual disturbance. Respiratory: Negative for cough, choking, chest tightness, shortness of breath, wheezing and stridor.  Cardiovascular: Negative for chest pain, palpitations and leg  swelling. Gastrointestinal: Negative for abdominal distention. Genitourinary: Negative for dysuria, urgency, frequency, hematuria, flank pain, decreased urine volume, difficulty urinating and dyspareunia.  Musculoskeletal: Negative for back pain, joint swelling, arthralgia and gait problem. Neurological: Negative for dizziness, tremors, seizures, syncope, facial asymmetry, speech difficulty, weakness, light-headedness, numbness and headaches.  Hematological: Negative for adenopathy. Does not bruise/bleed  easily. Psychiatric/Behavioral: Negative for hallucinations, behavioral problems, confusion, dysphoric mood, decreased concentration and agitation.    Objective:   Filed Vitals:   11/28/14 0955  BP: 129/86  Pulse: 92  Temp: 97.8 F (36.6 C)  Resp: 17    Physical Exam: Constitutional: Patient appears well-developed and well-nourished. No distress. HENT: Normocephalic, atraumatic, External right and left ear normal. Oropharynx is clear and moist.  Eyes: Conjunctivae and EOM are normal. PERRLA, no scleral icterus. Neck: Normal ROM. Neck supple. No JVD. No tracheal deviation. No thyromegaly. CVS: RRR, S1/S2 +, no murmurs, no gallops, no carotid bruit.  Pulmonary: Effort and breath sounds normal, no stridor, rhonchi, wheezes, rales.  Abdominal: Soft. BS +, no distension, tenderness, rebound or guarding.  Musculoskeletal: Normal range of motion. No edema and no tenderness.  Neuro: Alert. Normal reflexes, muscle tone coordination. No cranial nerve deficit. Skin: Skin is warm and dry. No rash noted. Not diaphoretic. No erythema. No pallor. Psychiatric: Normal mood and affect. Behavior, judgment, thought content normal.  Lab Results  Component Value Date   WBC 10.3 11/22/2014   HGB 9.7* 11/22/2014   HCT 29.3* 11/22/2014   MCV 85.7 11/22/2014   PLT 379 11/22/2014   Lab Results  Component Value Date   CREATININE 0.65 11/22/2014   BUN 5* 11/22/2014   NA 134* 11/22/2014   K 3.2* 11/22/2014   CL 97 11/22/2014   CO2 23 11/22/2014    No results found for: HGBA1C Lipid Panel  No results found for: CHOL, TRIG, HDL, CHOLHDL, VLDL, LDLCALC     Assessment and plan:   1. Bacterial meningitis/2. CSF rhinorrhea S/p sinus surgery on ABX scheduled to follow up with ENT   3. Anemia, unspecified anemia type Check anemia panel - Anemia panel  menorhagia  Referred to GYN  5. Family history of diabetes mellitus (DM)  - Hemoglobin A1c  6. Screening 100 baseline blood work -  COMPLETE METABOLIC PANEL WITH GFR - TSH - Vit D  25 hydroxy (rtn osteoporosis monitoring) - MM DIGITAL SCREENING BILATERAL; Future - Ambulatory referral to Obstetrics / Gynecology      Health Maintenance -Colonoscopy:-Pap Smear: referred to GYN -Mammogram: ordered  -Vaccinations:  uptodate with flu shot   Return in about 3 months (around 02/27/2015), or if symptoms worsen or fail to improve, for anemia.   Doris CheadleADVANI, Jasim Harari, MD

## 2014-11-28 NOTE — Progress Notes (Signed)
Patient here for hospital follow up Was admitted for bacterial meningitis Currently has a picc line in her right arm and on ABT Ceftriaxone [facility] comes out to the house to administer ABT and change picc tubing

## 2014-11-29 ENCOUNTER — Telehealth: Payer: Self-pay | Admitting: *Deleted

## 2014-11-29 LAB — VITAMIN D 25 HYDROXY (VIT D DEFICIENCY, FRACTURES): Vit D, 25-Hydroxy: 20 ng/mL — ABNORMAL LOW (ref 30–100)

## 2014-11-29 LAB — HEMOGLOBIN A1C
Hgb A1c MFr Bld: 5.8 % — ABNORMAL HIGH (ref <5.7)
MEAN PLASMA GLUCOSE: 120 mg/dL — AB (ref <117)

## 2014-11-29 MED ORDER — VITAMIN D (ERGOCALCIFEROL) 1.25 MG (50000 UNIT) PO CAPS: 50000.0000 [IU] | ORAL_CAPSULE | ORAL | Status: DC

## 2014-11-29 NOTE — Telephone Encounter (Signed)
Pt aware of lab results Rx [facility]ifer[facility] Advised to continue with iron supplement

## 2014-11-29 NOTE — Telephone Encounter (Signed)
-----   Message from Doris Cheadleeepak Advani, MD sent at 11/29/2014  9:25 AM EST ----- Blood work reviewed, noticed low vitamin D, call patient advise to start ergocalciferol 50,000 units once a week for the duration of  12 weeks. noticed hemoglobin A1c of 5.8%, patient has prediabetes, call and advise patient for low carbohydrate diet. Patient has anemia, likely secondary to iron deficiency continue with iron supplement. Also noticed borderline abnormal TSH level, will repeat the test on the following visit.

## 2014-11-30 ENCOUNTER — Telehealth: Payer: Self-pay

## 2014-11-30 NOTE — Telephone Encounter (Signed)
[facility] called  Patient is receiving rocephin through her picc line Paula Hoffman is requesting an order to pull the picc line The patient is now done with antibiotic therapy

## 2014-12-04 ENCOUNTER — Telehealth: Payer: Self-pay | Admitting: Internal Medicine

## 2014-12-04 NOTE — Telephone Encounter (Signed)
Nurse from [facility] called to check on status of order to discontinue PICC line. Please f/u with pt.

## 2014-12-04 NOTE — Telephone Encounter (Signed)
I spoke with [facility] rep. Mrs. Paula Hoffman and told her that if the antibiotics are finished then she could remove the pic line

## 2014-12-04 NOTE — Telephone Encounter (Signed)
If patient has completed the course of antibiotic, the PICC line can be removed.

## 2014-12-28 ENCOUNTER — Ambulatory Visit (HOSPITAL_COMMUNITY): Payer: Self-pay | Attending: Internal Medicine

## 2015-02-10 IMAGING — CT CT HEAD W/O CM
2 series · 16 of 30 positions shown, 20 images · non-contrast
Comparison: None.

CLINICAL DATA: Headache since 4 a.m. this morning. Nausea and
vomiting.

EXAM:
CT HEAD WITHOUT CONTRAST
TECHNIQUE: Contiguous axial images were obtained from the base of the skull
through the vertex without intravenous contrast.

[Series 201: head w/o, idose (1) · axial · non-contrast · 0.43mm/px · z∈[+106,+226]mm · 13 of 28 slices shown, 17 images]
[im 2/28  brain]
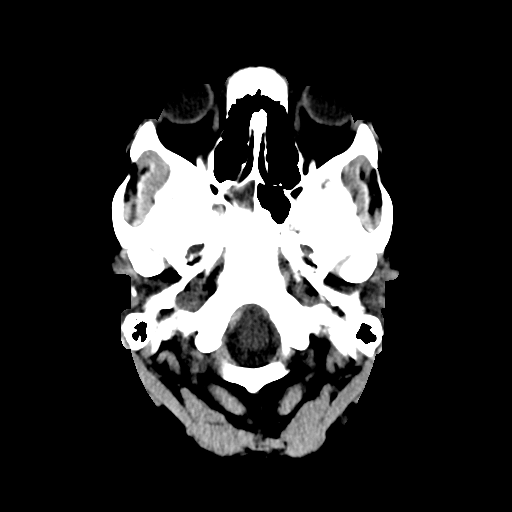
[im 2/28  bone]
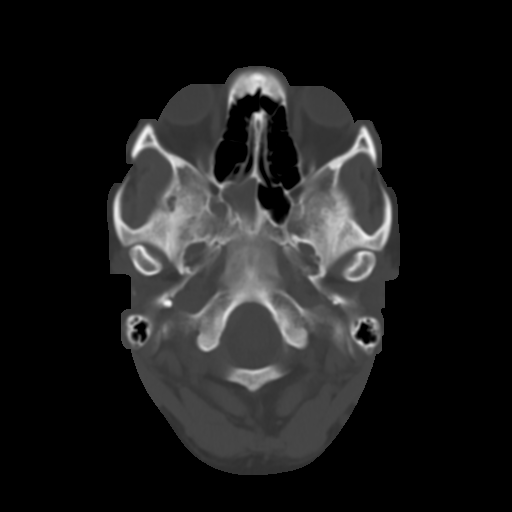
[im 4/28  brain]
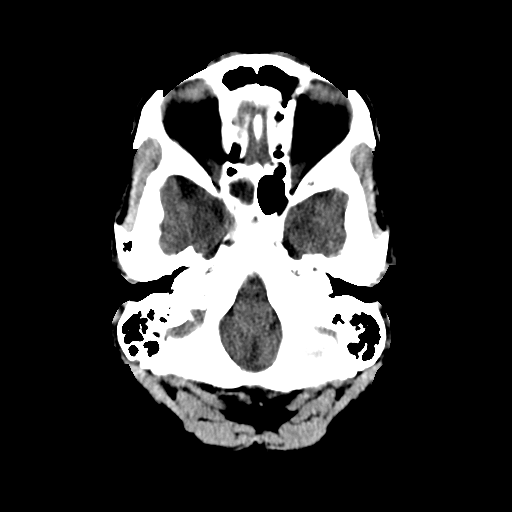
[im 6/28  brain]
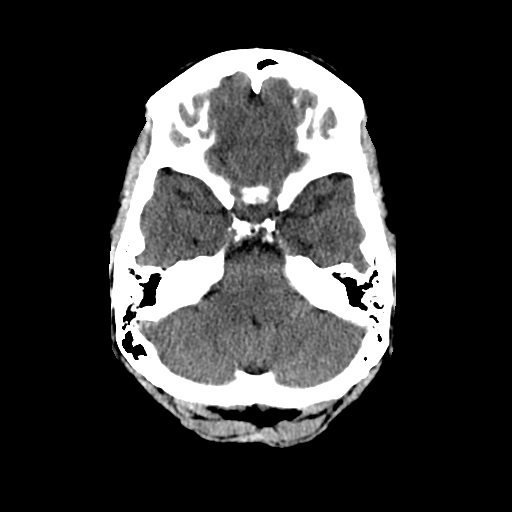
[im 8/28  brain]
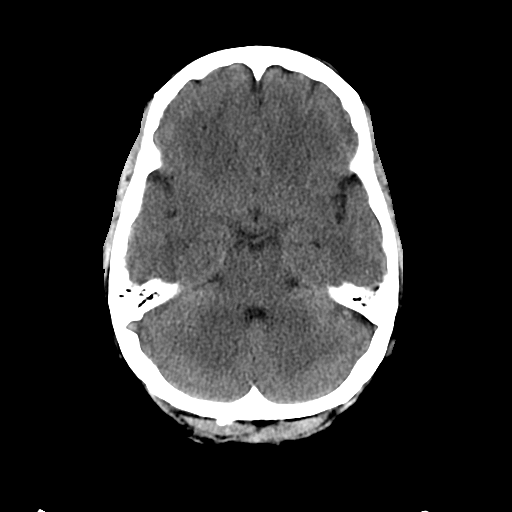
[im 10/28  brain]
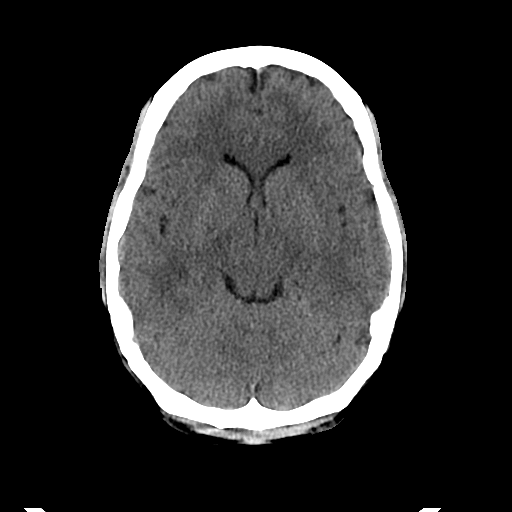
[im 10/28  bone]
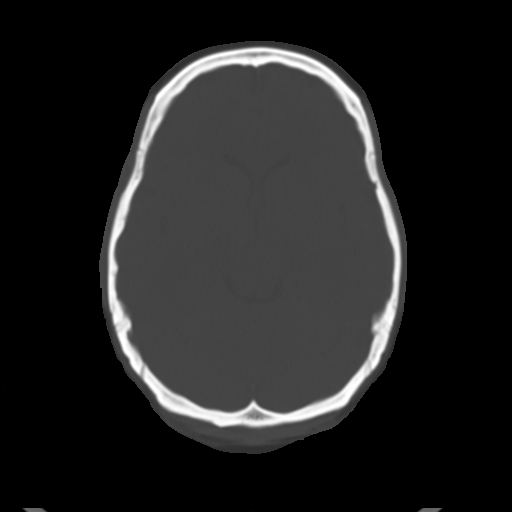
[im 12/28  brain]
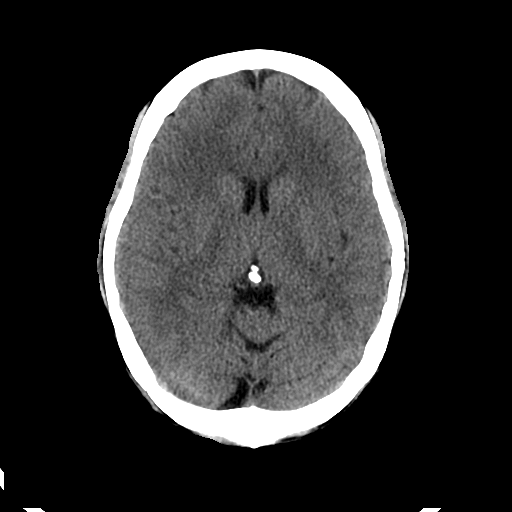
[im 14/28  brain]
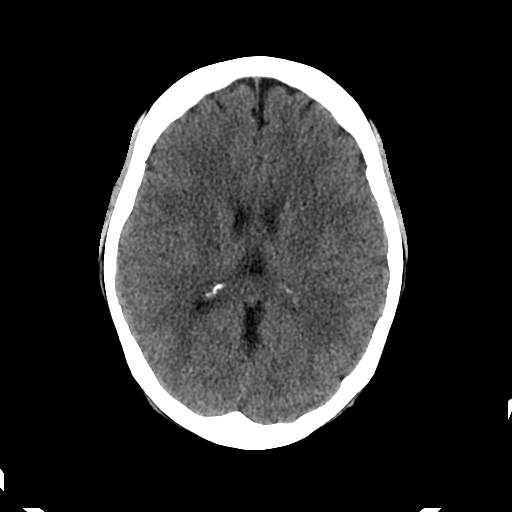
[im 16/28  brain]
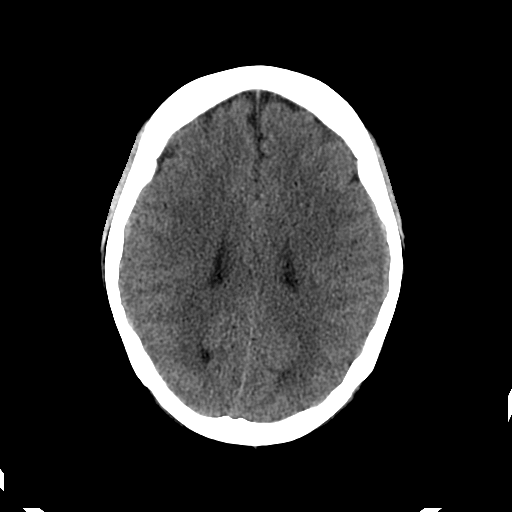
[im 18/28  brain]
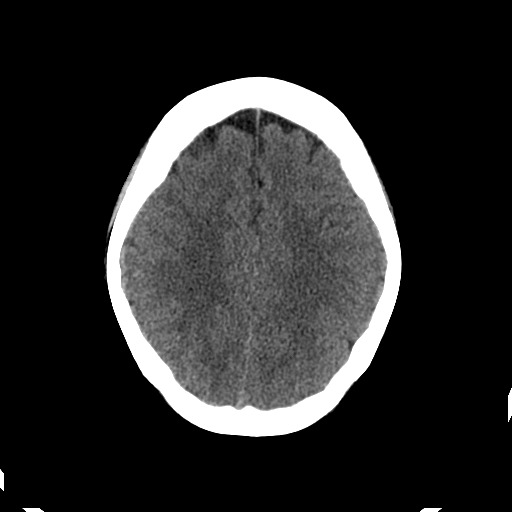
[im 18/28  bone]
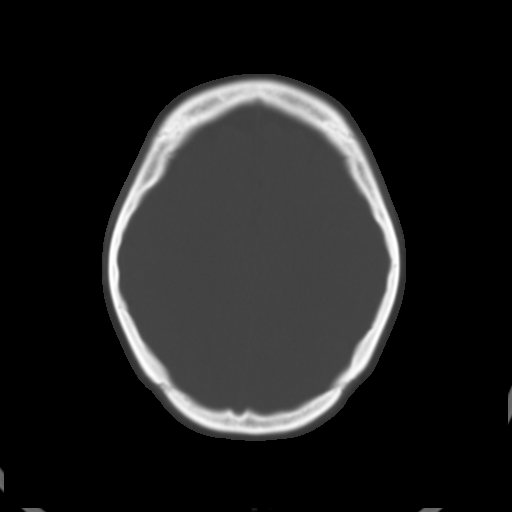
[im 20/28  brain]
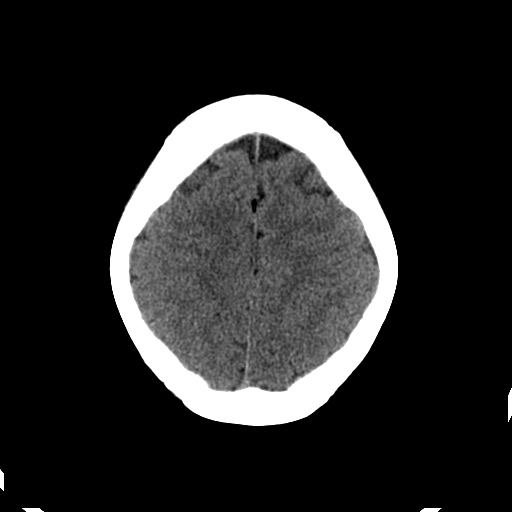
[im 22/28  brain]
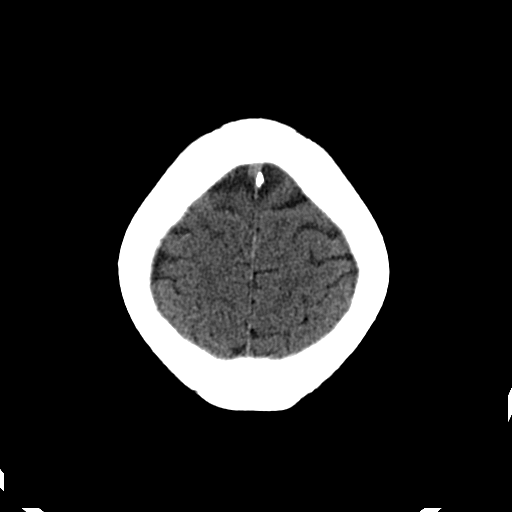
[im 24/28  brain]
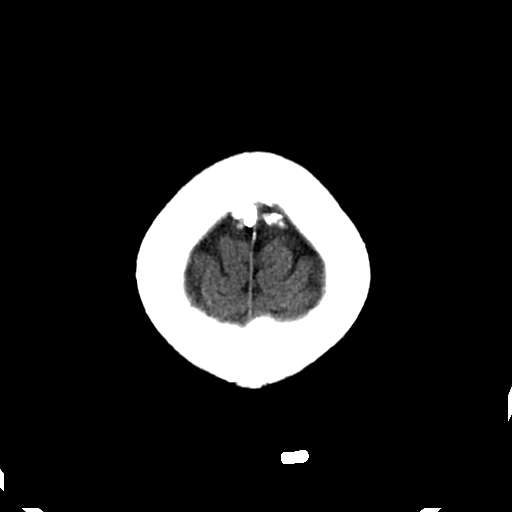
[im 26/28  brain]
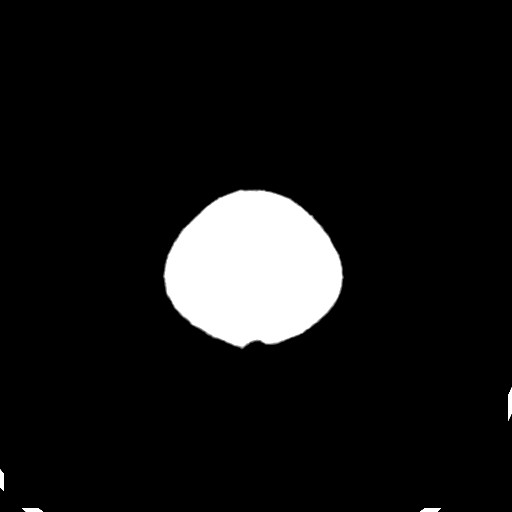
[im 26/28  bone]
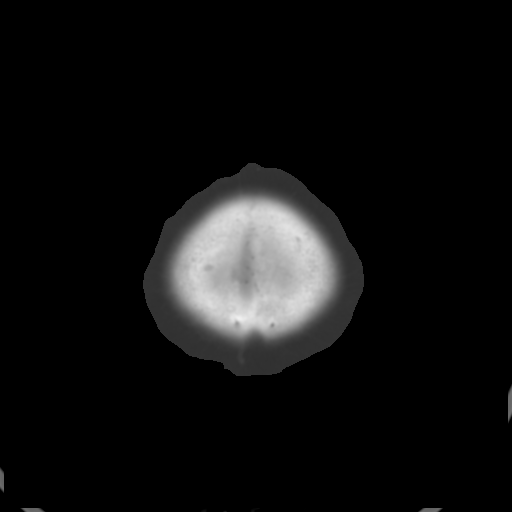

[Series 202: head w/o bone, idose (1) · axial · non-contrast · 0.43mm/px · z∈[+106,+146]mm · 3 of 28 slices shown]
[im 2/28  bone]
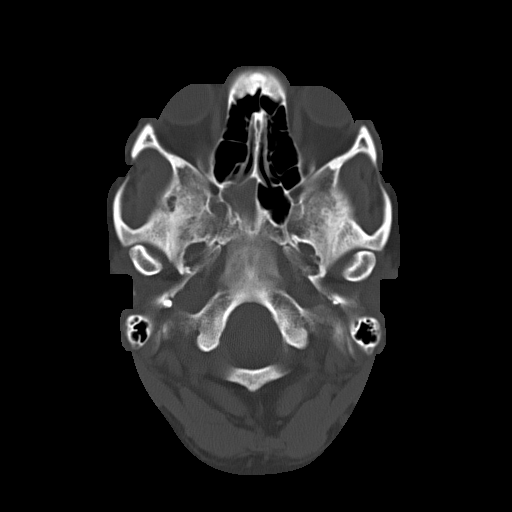
[im 6/28  bone]
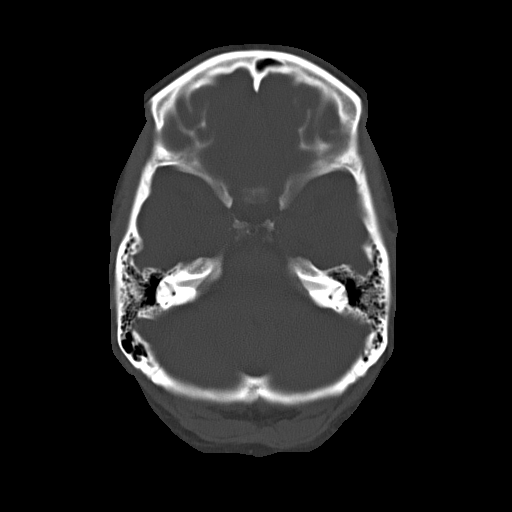
[im 10/28  bone]
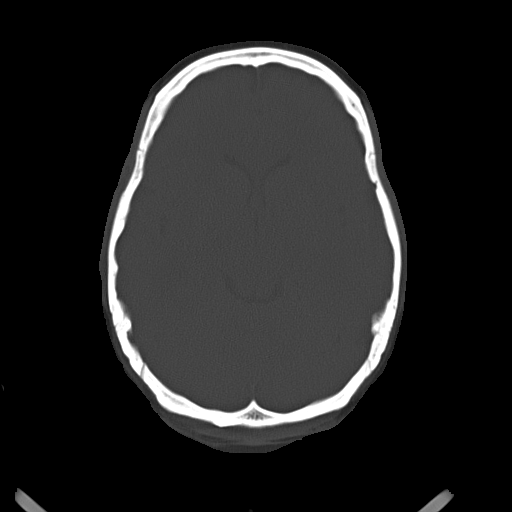

[16 of 30 positions shown; findings below may reference images not displayed]

FINDINGS: No acute intracranial hemorrhage. No focal mass lesion. No CT
evidence of acute infarction. No midline shift or mass effect. No
hydrocephalus. Basilar cisterns are patent.

Paranasal sinuses and  mastoid air cells are clear.
IMPRESSION: Normal head CT.

## 2017-10-27 ENCOUNTER — Encounter: Payer: Self-pay | Admitting: Urgent Care

## 2017-10-27 ENCOUNTER — Ambulatory Visit: Payer: 59 | Admitting: Urgent Care

## 2017-10-27 VITALS — BP 124/80 | HR 88 | Temp 98.8°F | Resp 17 | Ht 62.5 in | Wt 181.0 lb

## 2017-10-27 DIAGNOSIS — N926 Irregular menstruation, unspecified: Secondary | ICD-10-CM | POA: Diagnosis not present

## 2017-10-27 DIAGNOSIS — R002 Palpitations: Secondary | ICD-10-CM | POA: Diagnosis not present

## 2017-10-27 DIAGNOSIS — F419 Anxiety disorder, unspecified: Secondary | ICD-10-CM

## 2017-10-27 DIAGNOSIS — Z566 Other physical and mental strain related to work: Secondary | ICD-10-CM | POA: Diagnosis not present

## 2017-10-27 MED ORDER — ALPRAZOLAM 0.5 MG PO TABS: 0.5000 mg | ORAL_TABLET | Freq: Every day | ORAL | 0 refills | Status: AC | PRN

## 2017-10-27 MED ORDER — SERTRALINE HCL 50 MG PO TABS: 50.0000 mg | ORAL_TABLET | Freq: Every day | ORAL | 1 refills | Status: AC

## 2017-10-27 NOTE — Patient Instructions (Addendum)
[facility] [address] [place], [place] [address]   Shanon Rosser [phone]  Maris Berger [phone]  Marco Collie [phone]   [facility] ([address] Hulan Amato Beckey Rutter Joycelyn Schmid [place]) - [phone]  [facility]) - [phone]  [facility] - [phone]  [facility] - [phone]  Buena Irish - [phone]  [facility]  - [phone] (do not file insurance)     For the first 2 weeks, take Zoloft at 50mg  (1 tablet) daily. If you are not experiencing side effects from using Zoloft, then increase to 100mg  (2 tablets) once daily.    Sertraline tablets What is this medicine? SERTRALINE (SER tra leen) is used to treat depression. It may also be used to treat obsessive compulsive disorder, panic disorder, post-trauma stress, premenstrual dysphoric disorder (PMDD) or social anxiety. This medicine may be used for other purposes; ask your health care provider or pharmacist if you have questions. COMMON BRAND NAME(S): Zoloft What should I tell my health care provider before I take this medicine? They need to know if you have any of these conditions: -bleeding disorders -bipolar disorder or a family history of bipolar disorder -glaucoma -heart disease -high blood pressure -history of irregular heartbeat -history of low levels of calcium, magnesium, or potassium in the blood -if you often drink alcohol -liver disease -receiving electroconvulsive therapy -seizures -suicidal thoughts, plans, or attempt; a previous suicide attempt by you or a family member -take medicines that treat or prevent blood clots -thyroid disease -an unusual or allergic reaction to sertraline, other medicines, foods, dyes, or preservatives -pregnant or trying to get pregnant -breast-feeding How should I use this medicine? Take this  medicine by mouth with a glass of water. Follow the directions on the prescription label. You can take it with or without food. Take your medicine at regular intervals. Do not take your medicine more often than directed. Do not stop taking this medicine suddenly except upon the advice of your doctor. Stopping this medicine too quickly may cause serious side effects or your condition may worsen. A special MedGuide will be given to you by the pharmacist with each prescription and refill. Be sure to read this information carefully each time. Talk to your pediatrician regarding the use of this medicine in children. While this drug may be prescribed for children as young as 7 years for selected conditions, precautions do apply. Overdosage: If you think you have taken too much of this medicine contact a poison control center or emergency room at once. NOTE: This medicine is only for you. Do not share this medicine with others. What if I miss a dose? If you miss a dose, take it as soon as you can. If it is almost time for your next dose, take only that dose. Do not take double or extra doses. What may interact with this medicine? Do not take this medicine with any of the following medications: -cisapride -dofetilide -dronedarone -linezolid -MAOIs like Carbex, Eldepryl, Marplan, Nardil, and Parnate -methylene blue (injected into a vein) -pimozide -thioridazine This medicine may also interact with the following medications: -alcohol -amphetamines -aspirin and aspirin-like medicines -certain medicines for depression, anxiety, or psychotic disturbances -certain medicines for fungal infections like ketoconazole, fluconazole, posaconazole, and itraconazole -certain medicines for irregular heart beat like flecainide, quinidine, propafenone -certain medicines for migraine headaches like almotriptan, eletriptan, frovatriptan, naratriptan, rizatriptan, sumatriptan, zolmitriptan -certain medicines for  sleep -certain medicines for seizures like carbamazepine, valproic acid, phenytoin -  certain medicines that treat or prevent blood clots like warfarin, enoxaparin, dalteparin -cimetidine -digoxin -diuretics -fentanyl -isoniazid -lithium -NSAIDs, medicines for pain and inflammation, like ibuprofen or naproxen -other medicines that prolong the QT interval (cause an abnormal heart rhythm) -rasagiline -safinamide -supplements like St. John's wort, kava kava, valerian -tolbutamide -tramadol -tryptophan This list may not describe all possible interactions. Give your health care provider a list of all the medicines, herbs, non-prescription drugs, or dietary supplements you use. Also tell them if you smoke, drink alcohol, or use illegal drugs. Some items may interact with your medicine. What should I watch for while using this medicine? Tell your doctor if your symptoms do not get better or if they get worse. Visit your doctor or health care professional for regular checks on your progress. Because it may take several weeks to see the full effects of this medicine, it is important to continue your treatment as prescribed by your doctor. Patients and their families should watch out for new or worsening thoughts of suicide or depression. Also watch out for sudden changes in feelings such as feeling anxious, agitated, panicky, irritable, hostile, aggressive, impulsive, severely restless, overly excited and hyperactive, or not being able to sleep. If this happens, especially at the beginning of treatment or after a change in dose, call your health care professional. Bonita Quin may get drowsy or dizzy. Do not drive, use machinery, or do anything that needs mental alertness until you know how this medicine affects you. Do not stand or sit up quickly, especially if you are an older patient. This reduces the risk of dizzy or fainting spells. Alcohol may interfere with the effect of this medicine. Avoid alcoholic  drinks. Your mouth may get dry. Chewing sugarless gum or sucking hard candy, and drinking plenty of water may help. Contact your doctor if the problem does not go away or is severe. What side effects may I notice from receiving this medicine? Side effects that you should report to your doctor or health care professional as soon as possible: -allergic reactions like skin rash, itching or hives, swelling of the face, lips, or tongue -anxious -black, tarry stools -changes in vision -confusion -elevated mood, decreased need for sleep, racing thoughts, impulsive behavior -eye pain -fast, irregular heartbeat -feeling faint or lightheaded, falls -feeling agitated, angry, or irritable -hallucination, loss of contact with reality -loss of balance or coordination -loss of memory -painful or prolonged erections -restlessness, pacing, inability to keep still -seizures -stiff muscles -suicidal thoughts or other mood changes -trouble sleeping -unusual bleeding or bruising -unusually weak or tired -vomiting Side effects that usually do not require medical attention (report to your doctor or health care professional if they continue or are bothersome): -change in appetite or weight -change in sex drive or performance -diarrhea -increased sweating -indigestion, nausea -tremors This list may not describe all possible side effects. Call your doctor for medical advice about side effects. You may report side effects to FDA at 1-800-FDA-1088. Where should I keep my medicine? Keep out of the reach of children. Store at room temperature between 15 and 30 degrees C (59 and 86 degrees F). Throw away any unused medicine after the expiration date. NOTE: This sheet is a summary. It may not cover all possible information. If you have questions about this medicine, talk to your doctor, pharmacist, or health care provider.  2018 Elsevier/Gold Standard (2016-11-28 14:17:49)       Alprazolam  tablets What is this medicine? ALPRAZOLAM (al PRAY zoe lam) is a  benzodiazepine. It is used to treat anxiety and panic attacks. This medicine may be used for other purposes; ask your health care provider or pharmacist if you have questions. COMMON BRAND NAME(S): Xanax What should I tell my health care provider before I take this medicine? They need to know if you have any of these conditions: -an alcohol or drug abuse problem -bipolar disorder, depression, psychosis or other mental health conditions -glaucoma -kidney or liver disease -lung or breathing disease -myasthenia gravis -Parkinson's disease -porphyria -seizures or a history of seizures -suicidal thoughts -an unusual or allergic reaction to alprazolam, other benzodiazepines, foods, dyes, or preservatives -pregnant or trying to get pregnant -breast-feeding How should I use this medicine? Take this medicine by mouth with a glass of water. Follow the directions on the prescription label. Take your medicine at regular intervals. Do not take it more often than directed. Do not stop taking except on your doctor's advice. A special MedGuide will be given to you by the pharmacist with each prescription and refill. Be sure to read this information carefully each time. Talk to your pediatrician regarding the use of this medicine in children. Special care may be needed. Overdosage: If you think you have taken too much of this medicine contact a poison control center or emergency room at once. NOTE: This medicine is only for you. Do not share this medicine with others. What if I miss a dose? If you miss a dose, take it as soon as you can. If it is almost time for your next dose, take only that dose. Do not take double or extra doses. What may interact with this medicine? Do not take this medicine with any of the following medications: -certain antiviral medicines for HIV or AIDS like delavirdine, indinavir -certain medicines for fungal  infections like ketoconazole and itraconazole -narcotic medicines for cough -sodium oxybate This medicine may also interact with the following medications: -alcohol -antihistamines for allergy, cough and cold -certain antibiotics like clarithromycin, erythromycin, isoniazid, rifampin, rifapentine, rifabutin, and troleandomycin -certain medicines for blood pressure, heart disease, irregular heart beat -certain medicines for depression, like amitriptyline, fluoxetine, sertraline -certain medicines for seizures like carbamazepine, oxcarbazepine, phenobarbital, phenytoin, primidone -cimetidine -cyclosporine -female hormones, like estrogens or progestins and birth control pills, patches, rings, or injections -general anesthetics like halothane, isoflurane, methoxyflurane, propofol -grapefruit juice -local anesthetics like lidocaine, pramoxine, tetracaine -medicines that relax muscles for surgery -narcotic medicines for pain -other antiviral medicines for HIV or AIDS -phenothiazines like chlorpromazine, mesoridazine, prochlorperazine, thioridazine This list may not describe all possible interactions. Give your health care provider a list of all the medicines, herbs, non-prescription drugs, or dietary supplements you use. Also tell them if you smoke, drink alcohol, or use illegal drugs. Some items may interact with your medicine. What should I watch for while using this medicine? Tell your doctor or health care professional if your symptoms do not start to get better or if they get worse. Do not stop taking except on your doctor's advice. You may develop a severe reaction. Your doctor will tell you how much medicine to take. You may get drowsy or dizzy. Do not drive, use machinery, or do anything that needs mental alertness until you know how this medicine affects you. To reduce the risk of dizzy and fainting spells, do not stand or sit up quickly, especially if you are an older patient. Alcohol may  increase dizziness and drowsiness. Avoid alcoholic drinks. If you are taking another medicine that also causes drowsiness, you may  have more side effects. Give your health care provider a list of all medicines you use. Your doctor will tell you how much medicine to take. Do not take more medicine than directed. Call emergency for help if you have problems breathing or unusual sleepiness. What side effects may I notice from receiving this medicine? Side effects that you should report to your doctor or health care professional as soon as possible: -allergic reactions like skin rash, itching or hives, swelling of the face, lips, or tongue -breathing problems -confusion -loss of balance or coordination -signs and symptoms of low blood pressure like dizziness; feeling faint or lightheaded, falls; unusually weak or tired -suicidal thoughts or other mood changes Side effects that usually do not require medical attention (report to your doctor or health care professional if they continue or are bothersome): -dizziness -dry mouth -nausea, vomiting -tiredness This list may not describe all possible side effects. Call your doctor for medical advice about side effects. You may report side effects to FDA at 1-800-FDA-1088. Where should I keep my medicine? Keep out of the reach of children. This medicine can be abused. Keep your medicine in a safe place to protect it from theft. Do not share this medicine with anyone. Selling or giving away this medicine is dangerous and against the law. Store at room temperature between 20 and 25 degrees C (68 and 77 degrees F). This medicine may cause accidental overdose and death if taken by other adults, children, or pets. Mix any unused medicine with a substance like cat litter or coffee grounds. Then throw the medicine away in a sealed container like a sealed bag or a coffee can with a lid. Do not use the medicine after the expiration date. NOTE: This sheet is a summary.  It may not cover all possible information. If you have questions about this medicine, talk to your doctor, pharmacist, or health care provider.  2018 Elsevier/Gold Standard (2015-08-23 13:47:25)      IF you received an x-ray today, you will receive an invoice from [facility]. Please contact [facility] at [phone] with questions or concerns regarding your invoice.   IF you received labwork today, you will receive an invoice from [facility]. Please contact [facility] at [phone] with questions or concerns regarding your invoice.   Our billing staff will not be able to assist you with questions regarding bills from these companies.  You will be contacted with the lab results as soon as they are available. The fastest way to get your results is to activate your My Chart account. Instructions are located on the last page of this paperwork. If you have not heard from us regarding the results in 2 weeks, please contact this office.

## 2017-10-27 NOTE — Progress Notes (Signed)
    MRN: [id number] DOB: Feb 05, 1971  Subjective:   Paula Hoffman is a 46 y.o. female presenting for anxiety. 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 Admits that she has had episodes of heart racing, palpitations, intermittent episodes of sweating. She is also having crying spells at work and at home. She is having a very difficult time managing her stress and is affecting her sleep. Feels like her body is really off, had a prolonged cycle last month. Patient is very spiritual, has a good support network at church and home. Denies SI, HI. Denies smoking cigarettes.  Paula Hoffman has a current medication list which includes the following prescription(s): ferrous sulfate and multivitamin with minerals. Also is allergic to percocet [oxycodone-acetaminophen]; strawberry extract; and tramadol.  Paula Hoffman  has a past medical history of Bacterial meningitis. Also  has a past surgical history that includes Sinus endo w/fusion (Bilateral, 11/21/2014) and Abdominal fat graph ([name], 11/21/2014).  Objective:   Vitals: BP 124/80   Pulse 88   Temp 98.8 F (37.1 C) (Oral)   Resp 17   Ht 5' 2.5" (1.588 m)   Wt 181 lb (82.1 kg)   SpO2 98%   BMI 32.58 kg/m   Physical Exam  Constitutional: She is oriented to person, place, and time. She appears well-developed and well-nourished.  HENT:  Mouth/Throat: Oropharynx is clear and moist.  Eyes: No scleral icterus.  Neck: No thyromegaly present.  Cardiovascular: Normal rate, regular rhythm and intact distal pulses. Exam reveals no gallop and no friction rub.  No murmur heard. Pulmonary/Chest: No respiratory distress. She has no wheezes. She has no rales.  Neurological: She is alert and oriented to person, place, and time.   EKG interpretation - Normal sinus rhythm at 71bpm.  Assessment and Plan :   1. Anxiety 2. Stress  at work 3. Palpitations 4. Irregular menstrual cycle - Start medical and behavioral therapy. Reviewed Zoloft dosing and appropriate use of Xanax. Counseled patient on potential for adverse effects with medications prescribed today, patient verbalized understanding. Patient is to f/u in 2-6 weeks depending on how her symptoms progress. She may need to file FMLA which I agreed to help patient with as long as she is compliant with close follow up. - Labs pending  Wallis BambergMario Jadwiga Faidley, [place]PA-C [facility] [phone] 10/27/2017  8:38 AM

## 2017-10-28 LAB — CBC WITH DIFFERENTIAL/PLATELET
BASOS ABS: 0 10*3/uL (ref 0.0–0.2)
BASOS: 1 %
EOS (ABSOLUTE): 0 10*3/uL (ref 0.0–0.4)
Eos: 1 %
HEMOGLOBIN: 10.5 g/dL — AB (ref 11.1–15.9)
Hematocrit: 32.5 % — ABNORMAL LOW (ref 34.0–46.6)
IMMATURE GRANS (ABS): 0 10*3/uL (ref 0.0–0.1)
IMMATURE GRANULOCYTES: 0 %
LYMPHS: 25 %
Lymphocytes Absolute: 1.1 10*3/uL (ref 0.7–3.1)
MCH: 28.4 pg (ref 26.6–33.0)
MCHC: 32.3 g/dL (ref 31.5–35.7)
MCV: 88 fL (ref 79–97)
MONOCYTES: 8 %
Monocytes Absolute: 0.3 10*3/uL (ref 0.1–0.9)
Neutrophils Absolute: 2.7 10*3/uL (ref 1.4–7.0)
Neutrophils: 65 %
PLATELETS: 391 10*3/uL — AB (ref 150–379)
RBC: 3.7 x10E6/uL — ABNORMAL LOW (ref 3.77–5.28)
RDW: 14 % (ref 12.3–15.4)
WBC: 4.1 10*3/uL (ref 3.4–10.8)

## 2017-10-28 LAB — BASIC METABOLIC PANEL
BUN/Creatinine Ratio: 7 — ABNORMAL LOW (ref 9–23)
BUN: 6 mg/dL (ref 6–24)
CO2: 22 mmol/L (ref 20–29)
CREATININE: 0.82 mg/dL (ref 0.57–1.00)
Calcium: 9.6 mg/dL (ref 8.7–10.2)
Chloride: 101 mmol/L (ref 96–106)
GFR calc Af Amer: 99 mL/min/{1.73_m2} (ref >59)
GFR calc non Af Amer: 85 mL/min/{1.73_m2} (ref >59)
Glucose: 88 mg/dL (ref 65–99)
Potassium: 3.8 mmol/L (ref 3.5–5.2)
Sodium: 138 mmol/L (ref 134–144)

## 2017-10-28 LAB — TSH: TSH: 0.415 u[IU]/mL — ABNORMAL LOW (ref 0.450–4.500)

## 2017-11-10 ENCOUNTER — Ambulatory Visit: Payer: 59 | Admitting: Urgent Care

## 2017-11-10 ENCOUNTER — Encounter: Payer: Self-pay | Admitting: Urgent Care

## 2017-11-10 VITALS — BP 122/80 | HR 93 | Temp 98.7°F | Resp 17 | Ht 62.5 in | Wt 180.0 lb

## 2017-11-10 DIAGNOSIS — F419 Anxiety disorder, unspecified: Secondary | ICD-10-CM | POA: Diagnosis not present

## 2017-11-10 DIAGNOSIS — R7989 Other specified abnormal findings of blood chemistry: Secondary | ICD-10-CM

## 2017-11-10 DIAGNOSIS — R002 Palpitations: Secondary | ICD-10-CM | POA: Diagnosis not present

## 2017-11-10 DIAGNOSIS — Z566 Other physical and mental strain related to work: Secondary | ICD-10-CM | POA: Diagnosis not present

## 2017-11-10 NOTE — Patient Instructions (Addendum)
Thyroid-Stimulating Hormone Test Why am I having this test? A thyroid-stimulating hormone (TSH) test is a blood test that is done to measure the level of TSH, also known as thyrotropin, in your blood. TSH is produced by the pituitary gland. The pituitary gland is a small organ located just below the brain, behind your eyes and nasal passages. It is part of a system that monitors and maintains thyroid hormone levels and thyroid gland function. Thyroid hormones affect many body parts and systems, including the system that affects how quickly your body burns fuel for energy. Your health care provider may recommend testing your TSH level if you have signs and symptoms of abnormal thyroid hormone levels. Knowing the level of TSH in your blood can help your health care provider:  Diagnose a thyroid gland or pituitary gland disorder.  Manage your condition and treatment if you have hypothyroidism or hyperthyroidism.  What kind of sample is taken? A blood sample is required for this test. It is usually collected by inserting a needle into a vein. How do I prepare for this test? There is no preparation required for this test. What are the reference ranges? Reference rangesare considered healthy rangesestablished after testing a large group of healthy people. Reference rangesmay vary among different people, labs, and hospitals. It is your responsibility to obtain your test results. Ask the lab or department performing the test when and how you will get your results. Range of Normal Values:  Adult: 0.3-5 microunits/mL or 0.3-5 milliunits/L (SI units).  Newborn: 3-18 microunits/mL or 3-18 milliunits/L.  Cord: 3-12 microunits/mL or 3-12 milliunits/L.  What do the results mean? A high level of TSH may mean:  Your thyroid gland is not making enough thyroid hormones. When the thyroid gland does not make enough thyroid hormones, the pituitary gland releases TSH into the bloodstream. The  higher-than-normal levels of TSH prompt the thyroid gland to release more thyroid hormones.  You are getting an insufficient level of thyroid hormone medicine, if you are receiving this type of treatment.  There is a problem with the pituitary gland (rare).  A low level of TSH can indicate a problem with the pituitary gland. Talk with your health care provider to discuss your results, treatment options, and if necessary, the need for more tests. Talk with your health care provider if you have any questions about your results. Talk with your health care provider to discuss your results, treatment options, and if necessary, the need for more tests. Talk with your health care provider if you have any questions about your results. This information is not intended to replace advice given to you by your health care provider. Make sure you discuss any questions you have with your health care provider. Document Released: 12/19/2004 Document Revised: 07/27/2016 Document Reviewed: 04/19/2014 Elsevier Interactive Patient Education  2018 Elsevier Inc.     IF you received an x-ray today, you will receive an invoice from [facility]. Please contact [facility] at [phone] with questions or concerns regarding your invoice.   IF you received labwork today, you will receive an invoice from [facility]. Please contact [facility] at [phone] with questions or concerns regarding your invoice.   Our billing staff will not be able to assist you with questions regarding bills from these companies.  You will be contacted with the lab results as soon as they are available. The fastest way to get your results is to activate your My Chart account. Instructions are located on the last page of this   paperwork. If you have not heard from us regarding the results in 2 weeks, please contact this office.      

## 2017-11-10 NOTE — Progress Notes (Signed)
   MRN: [id number] DOB: Sep 28, 1971  Subjective:   Paula Hoffman is a 46 y.o. female presenting for follow up on severe anxiety. At her last office visit patient was started on sertraline, Xanax. She was having significant difficulties with her supervisor at work and was being reviewed. Today, she reports that she has not started her medication. Reports that she felt a lot better. Started talking with Paula Hoffman, a behavioral therapist based out of the state. She is working intensely with her. She was recommended for termination. However, she is appealing this and also filed a complaint against her supervisor. Regarding her TSH level, she denies ever being told she has thyroid disease. Has not had this worked up before.   Paula Hoffman has a current medication list which includes the following prescription(s): alprazolam, ferrous sulfate, multivitamin with minerals, and sertraline. Also is allergic to percocet [oxycodone-acetaminophen]; strawberry extract; and tramadol.  Paula Hoffman  has a past medical history of Bacterial meningitis. Also  has a past surgical history that includes Sinus endo w/fusion (Bilateral, 11/21/2014) and Abdominal fat graph ([name], 11/21/2014).  Objective:   Vitals: BP 122/80   Pulse 93   Temp 98.7 F (37.1 C) (Oral)   Resp 17   Ht 5' 2.5" (1.588 m)   Wt 180 lb (81.6 kg)   LMP 11/10/2017 (Approximate)   SpO2 98%   BMI 32.40 kg/m   Physical Exam  Constitutional: She is oriented to person, place, and time. She appears well-developed and well-nourished.  Cardiovascular: Normal rate.  Pulmonary/Chest: Effort normal.  Neurological: She is alert and oriented to person, place, and time.  Psychiatric: She has a normal mood and affect.   Assessment and Plan :   1. Anxiety 2. Stress at work 3. Palpitations - Improved, continue with Paula Hoffman. Encouraged patient to start Zoloft, use Xanax for panic attacks. Follow up as needed.  4. Low TSH level - Labs pending,  will f/u with treatment plan once labs result - Thyroid Profile   Wallis BambergMario Carmella Kees, PA-C [facility] [phone] 11/10/2017 12:06 PM

## 2017-11-11 LAB — THYROID PANEL
FREE THYROXINE INDEX: 1.8 (ref 1.2–4.9)
T3 Uptake Ratio: 25 % (ref 24–39)
T4, Total: 7.3 ug/dL (ref 4.5–12.0)

## 2020-03-05 ENCOUNTER — Ambulatory Visit: Payer: Self-pay | Attending: Internal Medicine

## 2020-03-05 DIAGNOSIS — Z23 Encounter for immunization: Secondary | ICD-10-CM

## 2020-03-05 NOTE — Progress Notes (Signed)
   Covid-[facility] Vaccination [facility]  Name:  Paula Hoffman    MRN: [id number] DOB: October 06, 1971  03/05/2020  [name] was observed post Covid-19 immunization for 15 minutes without incident. She was provided with Vaccine Information Sheet and instruction to access the V-Safe system.   [name] was instructed to call 911 with any severe reactions post vaccine: Marland Kitchen Difficulty breathing  . Swelling of face and throat  . A fast heartbeat  . A bad rash all over body  . Dizziness and weakness   Immunizations Administered    Name Date Dose VIS Date Route   Pfizer COVID-19 Vaccine 03/05/2020  3:02 PM 0.3 mL 11/18/2019 Intramuscular   Manufacturer: [employer], [employer]   Lot: [id number]   NDC: [address]-8

## 2020-03-28 ENCOUNTER — Ambulatory Visit: Payer: Self-pay | Attending: Internal Medicine

## 2020-03-28 DIAGNOSIS — Z23 Encounter for immunization: Secondary | ICD-10-CM

## 2020-03-28 NOTE — Progress Notes (Signed)
   Covid-19 Vaccination Clinic  Name:  Paula Hoffman    MRN: [id number] DOB: [birth year]  03/28/2020  [name] was observed post Covid-19 immunization for 15 minutes without incident. She was provided with Vaccine Information Sheet and instruction to access the V-Safe system.   [name] was instructed to call 911 with any severe reactions post vaccine: Marland Kitchen Difficulty breathing  . Swelling of face and throat  . A fast heartbeat  . A bad rash all over body  . Dizziness and weakness   Immunizations Administered    Name Date Dose VIS Date Route   Pfizer COVID-19 Vaccine 03/28/2020 11:11 AM 0.3 mL 02/01/2019 Intramuscular   Manufacturer: [employer], [employer]   Lot: [id number]   NDC: [address]-0

## 2020-09-06 ENCOUNTER — Other Ambulatory Visit: Payer: Self-pay

## 2020-09-06 DIAGNOSIS — Z20822 Contact with and (suspected) exposure to covid-19: Secondary | ICD-10-CM

## 2020-09-07 LAB — SARS-COV-2, NAA 2 DAY TAT

## 2020-09-07 LAB — NOVEL CORONAVIRUS, NAA: SARS-CoV-2, NAA: NOT DETECTED
